# Patient Record
Sex: Female | Born: 1935 | Race: Black or African American | Hispanic: No | Smoking: Never smoker
Health system: Southern US, Community
[De-identification: ages and names within clinical notes are randomized; demographics above are authoritative.]

## PROBLEM LIST (undated history)

## (undated) DIAGNOSIS — I639 Cerebral infarction, unspecified: Secondary | ICD-10-CM

## (undated) DIAGNOSIS — G309 Alzheimer's disease, unspecified: Secondary | ICD-10-CM

## (undated) DIAGNOSIS — F0280 Dementia in other diseases classified elsewhere without behavioral disturbance: Secondary | ICD-10-CM

## (undated) DIAGNOSIS — I10 Essential (primary) hypertension: Secondary | ICD-10-CM

## (undated) DIAGNOSIS — H409 Unspecified glaucoma: Secondary | ICD-10-CM

## (undated) DIAGNOSIS — I2510 Atherosclerotic heart disease of native coronary artery without angina pectoris: Secondary | ICD-10-CM

---

## 2014-03-11 ENCOUNTER — Emergency Department (HOSPITAL_COMMUNITY): Payer: Medicare Other

## 2014-03-11 ENCOUNTER — Encounter (HOSPITAL_COMMUNITY): Payer: Self-pay | Admitting: Emergency Medicine

## 2014-03-11 ENCOUNTER — Emergency Department (HOSPITAL_COMMUNITY)
Admission: EM | Admit: 2014-03-11 | Discharge: 2014-03-11 | Disposition: A | Discharge status: Home / Self Care | Payer: Medicare Other | Source: Home / Self Care | Attending: Emergency Medicine | Admitting: Emergency Medicine

## 2014-03-11 DIAGNOSIS — Y9289 Other specified places as the place of occurrence of the external cause: Secondary | ICD-10-CM | POA: Insufficient documentation

## 2014-03-11 DIAGNOSIS — Y9389 Activity, other specified: Secondary | ICD-10-CM | POA: Insufficient documentation

## 2014-03-11 DIAGNOSIS — Z8669 Personal history of other diseases of the nervous system and sense organs: Secondary | ICD-10-CM | POA: Insufficient documentation

## 2014-03-11 DIAGNOSIS — F0280 Dementia in other diseases classified elsewhere without behavioral disturbance: Secondary | ICD-10-CM

## 2014-03-11 DIAGNOSIS — Z8673 Personal history of transient ischemic attack (TIA), and cerebral infarction without residual deficits: Secondary | ICD-10-CM | POA: Insufficient documentation

## 2014-03-11 DIAGNOSIS — W06XXXA Fall from bed, initial encounter: Secondary | ICD-10-CM

## 2014-03-11 DIAGNOSIS — I10 Essential (primary) hypertension: Secondary | ICD-10-CM

## 2014-03-11 DIAGNOSIS — G309 Alzheimer's disease, unspecified: Secondary | ICD-10-CM

## 2014-03-11 DIAGNOSIS — M79642 Pain in left hand: Secondary | ICD-10-CM | POA: Diagnosis not present

## 2014-03-11 DIAGNOSIS — S72142A Displaced intertrochanteric fracture of left femur, initial encounter for closed fracture: Secondary | ICD-10-CM | POA: Diagnosis not present

## 2014-03-11 DIAGNOSIS — S0512XA Contusion of eyeball and orbital tissues, left eye, initial encounter: Secondary | ICD-10-CM | POA: Insufficient documentation

## 2014-03-11 DIAGNOSIS — H05232 Hemorrhage of left orbit: Secondary | ICD-10-CM

## 2014-03-11 HISTORY — DX: Dementia in other diseases classified elsewhere, unspecified severity, without behavioral disturbance, psychotic disturbance, mood disturbance, and anxiety: F02.80

## 2014-03-11 HISTORY — DX: Cerebral infarction, unspecified: I63.9

## 2014-03-11 HISTORY — DX: Essential (primary) hypertension: I10

## 2014-03-11 HISTORY — DX: Unspecified glaucoma: H40.9

## 2014-03-11 HISTORY — DX: Atherosclerotic heart disease of native coronary artery without angina pectoris: I25.10

## 2014-03-11 HISTORY — DX: Alzheimer's disease, unspecified: G30.9

## 2014-03-11 NOTE — ED Notes (Signed)
Patient transported to CT 

## 2014-03-11 NOTE — Discharge Instructions (Signed)
Hematoma °A hematoma is a collection of blood under the skin, in an organ, in a body space, in a joint space, or in other tissue. The blood can clot to form a lump that you can see and feel. The lump is often firm and may sometimes become sore and tender. Most hematomas get better in a few days to weeks. However, some hematomas may be serious and require medical care. Hematomas can range in size from very small to very large. °CAUSES  °A hematoma can be caused by a blunt or penetrating injury. It can also be caused by spontaneous leakage from a blood vessel under the skin. Spontaneous leakage from a blood vessel is more likely to occur in older people, especially those taking blood thinners. Sometimes, a hematoma can develop after certain medical procedures. °SIGNS AND SYMPTOMS  °· A firm lump on the body. °· Possible pain and tenderness in the area. °· Bruising. Blue, dark blue, purple-red, or yellowish skin may appear at the site of the hematoma if the hematoma is close to the surface of the skin. °For hematomas in deeper tissues or body spaces, the signs and symptoms may be subtle. For example, an intra-abdominal hematoma may cause abdominal pain, weakness, fainting, and shortness of breath. An intracranial hematoma may cause a headache or symptoms such as weakness, trouble speaking, or a change in consciousness. °DIAGNOSIS  °A hematoma can usually be diagnosed based on your medical history and a physical exam. Imaging tests may be needed if your health care provider suspects a hematoma in deeper tissues or body spaces, such as the abdomen, head, or chest. These tests may include ultrasonography or a CT scan.  °TREATMENT  °Hematomas usually go away on their own over time. Rarely does the blood need to be drained out of the body. Large hematomas or those that may affect vital organs will sometimes need surgical drainage or monitoring. °HOME CARE INSTRUCTIONS  °· Apply ice to the injured area:   °¨ Put ice in a  plastic bag.   °¨ Place a towel between your skin and the bag.   °¨ Leave the ice on for 20 minutes, 2-3 times a day for the first 1 to 2 days.   °· After the first 2 days, switch to using warm compresses on the hematoma.   °· Elevate the injured area to help decrease pain and swelling. Wrapping the area with an elastic bandage may also be helpful. Compression helps to reduce swelling and promotes shrinking of the hematoma. Make sure the bandage is not wrapped too tight.   °· If your hematoma is on a lower extremity and is painful, crutches may be helpful for a couple days.   °· Only take over-the-counter or prescription medicines as directed by your health care provider. °SEEK IMMEDIATE MEDICAL CARE IF:  °· You have increasing pain, or your pain is not controlled with medicine.   °· You have a fever.   °· You have worsening swelling or discoloration.   °· Your skin over the hematoma breaks or starts bleeding.   °· Your hematoma is in your chest or abdomen and you have weakness, shortness of breath, or a change in consciousness. °· Your hematoma is on your scalp (caused by a fall or injury) and you have a worsening headache or a change in alertness or consciousness. °MAKE SURE YOU:  °· Understand these instructions. °· Will watch your condition. °· Will get help right away if you are not doing well or get worse. °Document Released: 12/30/2003 Document Revised: 01/17/2013 Document Reviewed: 10/25/2012 °  ExitCare® Patient Information ©2015 [employer]. This information is not intended to replace advice given to you by your health care provider. Make sure you discuss any questions you have with your health care provider. °Head Injury °You have received a head injury. It does not appear serious at this time. Headaches and vomiting are common following head injury. It should be easy to awaken from sleeping. Sometimes it is necessary for you to stay in the emergency department for a while for observation. Sometimes  admission to the hospital may be needed. After injuries such as yours, most problems occur within the first 24 hours, but side effects may occur up to 7-10 days after the injury. It is important for you to carefully monitor your condition and contact your health care provider or seek immediate medical care if there is a change in your condition. °WHAT ARE THE TYPES OF HEAD INJURIES? °Head injuries can be as minor as a bump. Some head injuries can be more severe. More severe head injuries include: °· A jarring injury to the brain (concussion). °· A bruise of the brain (contusion). This mean there is bleeding in the brain that can cause swelling. °· A cracked skull (skull fracture). °· Bleeding in the brain that collects, clots, and forms a bump (hematoma). °WHAT CAUSES A HEAD INJURY? °A serious head injury is most likely to happen to someone who is in a car wreck and is not wearing a seat belt. Other causes of major head injuries include bicycle or motorcycle accidents, sports injuries, and falls. °HOW ARE HEAD INJURIES DIAGNOSED? °A complete history of the event leading to the injury and your current symptoms will be helpful in diagnosing head injuries. Many times, pictures of the brain, such as CT or MRI are needed to see the extent of the injury. Often, an overnight hospital stay is necessary for observation.  °WHEN SHOULD I SEEK IMMEDIATE MEDICAL CARE?  °You should get help right away if: °· You have confusion or drowsiness. °· You feel sick to your stomach (nauseous) or have continued, forceful vomiting. °· You have dizziness or unsteadiness that is getting worse. °· You have severe, continued headaches not relieved by medicine. Only take over-the-counter or prescription medicines for pain, fever, or discomfort as directed by your health care provider. °· You do not have normal function of the arms or legs or are unable to walk. °· You notice changes in the black spots in the center of the colored part of your  eye (pupil). °· You have a clear or bloody fluid coming from your nose or ears. °· You have a loss of vision. °During the next 24 hours after the injury, you must stay with someone who can watch you for the warning signs. This person should contact local emergency services (911 in the [place] if you have seizures, you become unconscious, or you are unable to wake up. °HOW CAN I PREVENT A HEAD INJURY IN THE FUTURE? °The most important factor for preventing major head injuries is avoiding motor vehicle accidents.  To minimize the potential for damage to your head, it is crucial to wear seat belts while riding in motor vehicles. Wearing helmets while bike riding and playing collision sports (like football) is also helpful. Also, avoiding dangerous activities around the house will further help reduce your risk of head injury.  °WHEN CAN I RETURN TO NORMAL ACTIVITIES AND ATHLETICS? °You should be reevaluated by your health care provider before returning to these activities. If you have any   of the following symptoms, you should not return to activities or contact sports until 1 week after the symptoms have stopped: °· Persistent headache. °· Dizziness or vertigo. °· Poor attention and concentration. °· Confusion. °· Memory problems. °· Nausea or vomiting. °· Fatigue or tire easily. °· Irritability. °· Intolerant of bright lights or loud noises. °· Anxiety or depression. °· Disturbed sleep. °MAKE SURE YOU:  °· Understand these instructions. °· Will watch your condition. °· Will get help right away if you are not doing well or get worse. °Document Released: 05/17/2005 Document Revised: 05/22/2013 Document Reviewed: 01/22/2013 °ExitCare® Patient Information ©2015 [employer]. This information is not intended to replace advice given to you by your health care provider. Make sure you discuss any questions you have with your health care provider. ° °

## 2014-03-11 NOTE — ED Notes (Signed)
Pt [facility] out of bed [facility], she has a head hematoma

## 2014-03-11 NOTE — ED Notes (Signed)
Pt given ice bag and placed on left side of face where eye is swollen

## 2014-03-11 NOTE — ED Notes (Signed)
Bed: WA15 Expected date:  Expected time:  Means of arrival:  Comments: EMS  

## 2014-03-11 NOTE — ED Notes (Signed)
[facility] called for transport back to SNF 

## 2014-03-11 NOTE — ED Provider Notes (Signed)
CSN: [id number]     Arrival date & time 03/11/14  0520 History   First MD Initiated Contact with Patient 03/11/14 0526     Chief Complaint  Patient presents with  . Fall     (Consider location/radiation/quality/duration/timing/severity/associated sxs/prior Treatment) HPI Comments: Patient here after being found was left-sided facial swelling in her bed. She has a history of dementia and it is suspected that she fell. Unknown loss of consciousness. She is at her baseline at this time. Has facial swelling at the left lateral periorbital area. No eye trauma noted. No other injuries noted. EMS called and patient transported here.  Patient is a 80 y.o. female presenting with fall. The history is provided by the patient and the EMS personnel. The history is limited by the condition of the patient.  Fall    Past Medical History  Diagnosis Date  . Alzheimer's dementia   . Hypertension   . Glaucoma   . CVA (cerebral infarction)   . Arteriosclerotic cardiovascular disease    History reviewed. No pertinent past surgical history. History reviewed. No pertinent family history. History  Substance Use Topics  . Smoking status: Never Smoker   . Smokeless tobacco: Not on file  . Alcohol Use: No   OB History   Grav Para Term Preterm Abortions TAB SAB Ect Mult Living                 Review of Systems  Unable to perform ROS     Allergies  Review of patient's allergies indicates no known allergies.  Home Medications   Prior to Admission medications   Not on File   BP 160/88  Pulse 70  Temp(Src) 97.8 F (36.6 C) (Oral)  Resp 18  SpO2 95% Physical Exam  Nursing note and vitals reviewed. Constitutional: She appears well-developed and well-nourished.  Non-toxic appearance. No distress.  HENT:  Head: Normocephalic and atraumatic.    Eyes: Conjunctivae, EOM and lids are normal. Pupils are equal, round, and reactive to light. Left conjunctiva has no hemorrhage.  No hyphema  noted.  Neck: Normal range of motion. Neck supple. No tracheal deviation present. No mass present.  Cardiovascular: Normal rate, regular rhythm and normal heart sounds.  Exam reveals no gallop.   No murmur heard. Pulmonary/Chest: Effort normal and breath sounds normal. No stridor. No respiratory distress. She has no decreased breath sounds. She has no wheezes. She has no rhonchi. She has no rales.  Abdominal: Soft. Normal appearance and bowel sounds are normal. She exhibits no distension. There is no tenderness. There is no rebound and no CVA tenderness.  Musculoskeletal: Normal range of motion. She exhibits no edema and no tenderness.  Neurological: She is alert. She has normal strength. No cranial nerve deficit or sensory deficit. GCS eye subscore is 4. GCS verbal subscore is 4. GCS motor subscore is 6.  Skin: Skin is warm and dry. No abrasion and no rash noted.  Psychiatric: Her affect is blunt.    ED Course  Procedures (including critical care time) Labs Review Labs Reviewed - No data to display  Imaging Review No results found.   EKG Interpretation None      MDM   Final diagnoses:  None   His CT results noted and patient stable for discharge    Toy BakerAnthony T Breona Cherubin, MD 03/11/14 [phone]

## 2014-03-14 ENCOUNTER — Inpatient Hospital Stay (HOSPITAL_COMMUNITY)
Admission: EM | Admit: 2014-03-14 | Discharge: 2014-03-19 | DRG: 481 | Disposition: A | Discharge status: Skilled Nursing Facility | Payer: Medicare Other | Attending: Family Medicine | Admitting: Family Medicine

## 2014-03-14 ENCOUNTER — Encounter (HOSPITAL_COMMUNITY): Payer: Self-pay | Admitting: Emergency Medicine

## 2014-03-14 ENCOUNTER — Observation Stay (HOSPITAL_COMMUNITY): Payer: Medicare Other | Admitting: Anesthesiology

## 2014-03-14 ENCOUNTER — Observation Stay (HOSPITAL_COMMUNITY): Payer: Medicare Other

## 2014-03-14 ENCOUNTER — Emergency Department (HOSPITAL_COMMUNITY): Payer: Medicare Other

## 2014-03-14 ENCOUNTER — Encounter (HOSPITAL_COMMUNITY)
Admission: EM | Disposition: A | Discharge status: Skilled Nursing Facility | Payer: Self-pay | Source: Home / Self Care | Attending: Internal Medicine

## 2014-03-14 ENCOUNTER — Encounter (HOSPITAL_COMMUNITY): Payer: Medicare Other | Admitting: Anesthesiology

## 2014-03-14 DIAGNOSIS — G309 Alzheimer's disease, unspecified: Secondary | ICD-10-CM | POA: Diagnosis present

## 2014-03-14 DIAGNOSIS — S72002A Fracture of unspecified part of neck of left femur, initial encounter for closed fracture: Secondary | ICD-10-CM

## 2014-03-14 DIAGNOSIS — R7989 Other specified abnormal findings of blood chemistry: Secondary | ICD-10-CM | POA: Diagnosis not present

## 2014-03-14 DIAGNOSIS — B370 Candidal stomatitis: Secondary | ICD-10-CM | POA: Diagnosis not present

## 2014-03-14 DIAGNOSIS — S0083XA Contusion of other part of head, initial encounter: Secondary | ICD-10-CM | POA: Diagnosis present

## 2014-03-14 DIAGNOSIS — H409 Unspecified glaucoma: Secondary | ICD-10-CM | POA: Diagnosis present

## 2014-03-14 DIAGNOSIS — I10 Essential (primary) hypertension: Secondary | ICD-10-CM | POA: Diagnosis present

## 2014-03-14 DIAGNOSIS — D72829 Elevated white blood cell count, unspecified: Secondary | ICD-10-CM | POA: Diagnosis present

## 2014-03-14 DIAGNOSIS — D638 Anemia in other chronic diseases classified elsewhere: Secondary | ICD-10-CM | POA: Diagnosis present

## 2014-03-14 DIAGNOSIS — Z79899 Other long term (current) drug therapy: Secondary | ICD-10-CM

## 2014-03-14 DIAGNOSIS — Y92129 Unspecified place in nursing home as the place of occurrence of the external cause: Secondary | ICD-10-CM

## 2014-03-14 DIAGNOSIS — D649 Anemia, unspecified: Secondary | ICD-10-CM

## 2014-03-14 DIAGNOSIS — R296 Repeated falls: Secondary | ICD-10-CM | POA: Diagnosis present

## 2014-03-14 DIAGNOSIS — S72142A Displaced intertrochanteric fracture of left femur, initial encounter for closed fracture: Principal | ICD-10-CM | POA: Diagnosis present

## 2014-03-14 DIAGNOSIS — S72002S Fracture of unspecified part of neck of left femur, sequela: Secondary | ICD-10-CM

## 2014-03-14 DIAGNOSIS — F0280 Dementia in other diseases classified elsewhere without behavioral disturbance: Secondary | ICD-10-CM | POA: Diagnosis present

## 2014-03-14 DIAGNOSIS — M79642 Pain in left hand: Secondary | ICD-10-CM | POA: Diagnosis present

## 2014-03-14 DIAGNOSIS — Z8673 Personal history of transient ischemic attack (TIA), and cerebral infarction without residual deficits: Secondary | ICD-10-CM | POA: Diagnosis not present

## 2014-03-14 DIAGNOSIS — W19XXXA Unspecified fall, initial encounter: Secondary | ICD-10-CM | POA: Diagnosis present

## 2014-03-14 DIAGNOSIS — M24562 Contracture, left knee: Secondary | ICD-10-CM | POA: Diagnosis present

## 2014-03-14 DIAGNOSIS — N179 Acute kidney failure, unspecified: Secondary | ICD-10-CM | POA: Diagnosis not present

## 2014-03-14 DIAGNOSIS — I2510 Atherosclerotic heart disease of native coronary artery without angina pectoris: Secondary | ICD-10-CM | POA: Diagnosis present

## 2014-03-14 DIAGNOSIS — Z419 Encounter for procedure for purposes other than remedying health state, unspecified: Secondary | ICD-10-CM

## 2014-03-14 DIAGNOSIS — S72009A Fracture of unspecified part of neck of unspecified femur, initial encounter for closed fracture: Secondary | ICD-10-CM

## 2014-03-14 HISTORY — PX: FEMUR IM NAIL: SHX1597

## 2014-03-14 HISTORY — DX: Cerebral infarction, unspecified: I63.9

## 2014-03-14 LAB — URINALYSIS, ROUTINE W REFLEX MICROSCOPIC
BILIRUBIN URINE: NEGATIVE
Glucose, UA: NEGATIVE mg/dL
HGB URINE DIPSTICK: NEGATIVE
KETONES UR: NEGATIVE mg/dL
Leukocytes, UA: NEGATIVE
NITRITE: NEGATIVE
PH: 6 (ref 5.0–8.0)
Protein, ur: NEGATIVE mg/dL
SPECIFIC GRAVITY, URINE: 1.019 (ref 1.005–1.030)
Urobilinogen, UA: 0.2 mg/dL (ref 0.0–1.0)

## 2014-03-14 LAB — ABO/RH: ABO/RH(D): O NEG

## 2014-03-14 LAB — SURGICAL PCR SCREEN
MRSA, PCR: NEGATIVE
Staphylococcus aureus: NEGATIVE

## 2014-03-14 LAB — TROPONIN I: Troponin I: <0.3 ng/mL (ref <0.30)

## 2014-03-14 LAB — BASIC METABOLIC PANEL
Anion gap: 12 (ref 5–15)
BUN: 30 mg/dL — AB (ref 6–23)
CO2: 27 mEq/L (ref 19–32)
CREATININE: 0.85 mg/dL (ref 0.50–1.10)
Calcium: 9.5 mg/dL (ref 8.4–10.5)
Chloride: 99 mEq/L (ref 96–112)
GFR calc non Af Amer: 59 mL/min — ABNORMAL LOW (ref >90)
GFR, EST AFRICAN AMERICAN: 68 mL/min — AB (ref >90)
GLUCOSE: 145 mg/dL — AB (ref 70–99)
POTASSIUM: 3.9 meq/L (ref 3.7–5.3)
Sodium: 138 mEq/L (ref 137–147)

## 2014-03-14 LAB — CBC WITH DIFFERENTIAL/PLATELET
Basophils Absolute: 0 10*3/uL (ref 0.0–0.1)
Basophils Relative: 0 % (ref 0–1)
EOS ABS: 0 10*3/uL (ref 0.0–0.7)
Eosinophils Relative: 0 % (ref 0–5)
HCT: 31.9 % — ABNORMAL LOW (ref 36.0–46.0)
HEMOGLOBIN: 10.1 g/dL — AB (ref 12.0–15.0)
Lymphocytes Relative: 6 % — ABNORMAL LOW (ref 12–46)
Lymphs Abs: 0.8 10*3/uL (ref 0.7–4.0)
MCH: 27.6 pg (ref 26.0–34.0)
MCHC: 31.7 g/dL (ref 30.0–36.0)
MCV: 87.2 fL (ref 78.0–100.0)
MONOS PCT: 4 % (ref 3–12)
Monocytes Absolute: 0.5 10*3/uL (ref 0.1–1.0)
NEUTROS PCT: 90 % — AB (ref 43–77)
Neutro Abs: 12 10*3/uL — ABNORMAL HIGH (ref 1.7–7.7)
Platelets: 205 10*3/uL (ref 150–400)
RBC: 3.66 MIL/uL — AB (ref 3.87–5.11)
RDW: 13.9 % (ref 11.5–15.5)
WBC: 13.3 10*3/uL — ABNORMAL HIGH (ref 4.0–10.5)

## 2014-03-14 SURGERY — INSERTION, INTRAMEDULLARY ROD, FEMUR
Anesthesia: General | Site: Hip | Laterality: Left

## 2014-03-14 MED ORDER — ONDANSETRON HCL 4 MG/2ML IJ SOLN: 4.0000 mg | Freq: Four times a day (QID) | INTRAMUSCULAR | Status: DC | PRN

## 2014-03-14 MED ORDER — DEXAMETHASONE SODIUM PHOSPHATE 10 MG/ML IJ SOLN
INTRAMUSCULAR | Status: AC
  Filled 2014-03-14: qty 1

## 2014-03-14 MED ORDER — BACITRACIN-NEOMYCIN-POLYMYXIN 400-5-5000 EX OINT
1.0000 | TOPICAL_OINTMENT | CUTANEOUS | Status: DC | PRN
Start: 2014-03-14 — End: 2014-03-19

## 2014-03-14 MED ORDER — SENNA 8.6 MG PO TABS
1.0000 | ORAL_TABLET | Freq: Every day | ORAL | Status: DC
  Administered 2014-03-14 – 2014-03-18 (×5): 8.6 mg via ORAL

## 2014-03-14 MED ORDER — ACETAMINOPHEN 10 MG/ML IV SOLN
1000.0000 mg | Freq: Once | INTRAVENOUS | Status: DC
  Administered 2014-03-14: 1000 mg via INTRAVENOUS

## 2014-03-14 MED ORDER — DIVALPROEX SODIUM 125 MG PO CPSP
125.0000 mg | ORAL_CAPSULE | Freq: Two times a day (BID) | ORAL | Status: DC
  Administered 2014-03-14 – 2014-03-19 (×10): 125 mg via ORAL
  Filled 2014-03-14 (×12): qty 1

## 2014-03-14 MED ORDER — ONDANSETRON HCL 4 MG PO TABS: 4.0000 mg | ORAL_TABLET | Freq: Four times a day (QID) | ORAL | Status: DC | PRN

## 2014-03-14 MED ORDER — THERA-DERM EX LOTN
1.0000 "application " | TOPICAL_LOTION | Freq: Every day | CUTANEOUS | Status: DC
  Administered 2014-03-14 – 2014-03-18 (×4): 1 via TOPICAL
  Filled 2014-03-14: qty 236

## 2014-03-14 MED ORDER — ACETAMINOPHEN 500 MG PO TABS: 500.0000 mg | ORAL_TABLET | ORAL | Status: DC | PRN

## 2014-03-14 MED ORDER — ALBUTEROL SULFATE (2.5 MG/3ML) 0.083% IN NEBU: 2.5000 mg | INHALATION_SOLUTION | RESPIRATORY_TRACT | Status: DC | PRN

## 2014-03-14 MED ORDER — METOCLOPRAMIDE HCL 5 MG/ML IJ SOLN: 5.0000 mg | Freq: Three times a day (TID) | INTRAMUSCULAR | Status: DC | PRN

## 2014-03-14 MED ORDER — SODIUM CHLORIDE 0.9 % IV SOLN
10.0000 mg | INTRAVENOUS | Status: DC | PRN
  Administered 2014-03-14: 80 ug/min via INTRAVENOUS

## 2014-03-14 MED ORDER — DOCUSATE SODIUM 100 MG PO CAPS
100.0000 mg | ORAL_CAPSULE | Freq: Two times a day (BID) | ORAL | Status: DC
  Administered 2014-03-14 – 2014-03-18 (×8): 100 mg via ORAL

## 2014-03-14 MED ORDER — BISACODYL 5 MG PO TBEC: 5.0000 mg | DELAYED_RELEASE_TABLET | Freq: Every day | ORAL | Status: DC | PRN

## 2014-03-14 MED ORDER — HYDROCODONE-ACETAMINOPHEN 5-325 MG PO TABS: 1.0000 | ORAL_TABLET | ORAL | Status: AC | PRN

## 2014-03-14 MED ORDER — DEXAMETHASONE SODIUM PHOSPHATE 10 MG/ML IJ SOLN
INTRAMUSCULAR | Status: DC | PRN
  Administered 2014-03-14: 10 mg via INTRAVENOUS

## 2014-03-14 MED ORDER — CEFAZOLIN SODIUM-DEXTROSE 2-3 GM-% IV SOLR
INTRAVENOUS | Status: DC | PRN
  Administered 2014-03-14: 2 g via INTRAVENOUS

## 2014-03-14 MED ORDER — MAGNESIUM CITRATE PO SOLN
1.0000 | Freq: Once | ORAL | Status: AC | PRN
Start: 2014-03-14 — End: 2014-03-14

## 2014-03-14 MED ORDER — ATROPINE SULFATE 0.4 MG/ML IJ SOLN
INTRAMUSCULAR | Status: AC
  Filled 2014-03-14: qty 1

## 2014-03-14 MED ORDER — ALBUTEROL SULFATE HFA 108 (90 BASE) MCG/ACT IN AERS: 2.0000 | INHALATION_SPRAY | RESPIRATORY_TRACT | Status: DC | PRN

## 2014-03-14 MED ORDER — GLYCOPYRROLATE 0.2 MG/ML IJ SOLN
INTRAMUSCULAR | Status: DC | PRN
  Administered 2014-03-14: .2 mg via INTRAVENOUS

## 2014-03-14 MED ORDER — GUAIFENESIN 100 MG/5ML PO SYRP
100.0000 mg | ORAL_SOLUTION | Freq: Four times a day (QID) | ORAL | Status: DC | PRN
  Filled 2014-03-14: qty 5

## 2014-03-14 MED ORDER — MORPHINE SULFATE 2 MG/ML IJ SOLN: 2.0000 mg | INTRAMUSCULAR | Status: DC | PRN

## 2014-03-14 MED ORDER — CEFAZOLIN SODIUM-DEXTROSE 2-3 GM-% IV SOLR
INTRAVENOUS | Status: AC
  Filled 2014-03-14: qty 50

## 2014-03-14 MED ORDER — FENTANYL CITRATE 0.05 MG/ML IJ SOLN
INTRAMUSCULAR | Status: AC
  Filled 2014-03-14: qty 2

## 2014-03-14 MED ORDER — SODIUM CHLORIDE 0.9 % IJ SOLN
INTRAMUSCULAR | Status: AC
  Filled 2014-03-14: qty 10

## 2014-03-14 MED ORDER — FENTANYL CITRATE 0.05 MG/ML IJ SOLN
INTRAMUSCULAR | Status: DC | PRN
  Administered 2014-03-14: 25 ug via INTRAVENOUS

## 2014-03-14 MED ORDER — NEOSTIGMINE METHYLSULFATE 10 MG/10ML IV SOLN
INTRAVENOUS | Status: DC | PRN
  Administered 2014-03-14: 1 mg via INTRAVENOUS

## 2014-03-14 MED ORDER — FENTANYL CITRATE 0.05 MG/ML IJ SOLN
INTRAMUSCULAR | Status: AC
  Filled 2014-03-14: qty 5

## 2014-03-14 MED ORDER — PROPOFOL 10 MG/ML IV BOLUS: INTRAVENOUS | Status: DC | PRN

## 2014-03-14 MED ORDER — DOCUSATE SODIUM 150 MG/15ML PO LIQD: 100.0000 mg | Freq: Two times a day (BID) | ORAL | Status: DC

## 2014-03-14 MED ORDER — LIDOCAINE HCL (CARDIAC) 20 MG/ML IV SOLN
INTRAVENOUS | Status: DC | PRN
  Administered 2014-03-14: 50 mg via INTRAVENOUS

## 2014-03-14 MED ORDER — SODIUM CHLORIDE 0.9 % IR SOLN
Status: DC | PRN
  Administered 2014-03-14: 20:00:00

## 2014-03-14 MED ORDER — PHENYLEPHRINE HCL 10 MG/ML IJ SOLN
INTRAMUSCULAR | Status: DC | PRN
  Administered 2014-03-14: 80 ug via INTRAVENOUS

## 2014-03-14 MED ORDER — CEFAZOLIN SODIUM-DEXTROSE 2-3 GM-% IV SOLR: 2.0000 g | INTRAVENOUS | Status: DC

## 2014-03-14 MED ORDER — PHENOL 1.4 % MT LIQD
1.0000 | OROMUCOSAL | Status: DC | PRN
  Filled 2014-03-14: qty 177

## 2014-03-14 MED ORDER — LACTATED RINGERS IV SOLN
INTRAVENOUS | Status: DC | PRN
  Administered 2014-03-14: 18:00:00 via INTRAVENOUS

## 2014-03-14 MED ORDER — PROPOFOL 10 MG/ML IV BOLUS
INTRAVENOUS | Status: AC
  Filled 2014-03-14: qty 20

## 2014-03-14 MED ORDER — SENNOSIDES-DOCUSATE SODIUM 8.6-50 MG PO TABS: 1.0000 | ORAL_TABLET | Freq: Every evening | ORAL | Status: DC | PRN

## 2014-03-14 MED ORDER — SODIUM CHLORIDE 0.9 % IV SOLN: INTRAVENOUS | Status: AC

## 2014-03-14 MED ORDER — SUCCINYLCHOLINE CHLORIDE 20 MG/ML IJ SOLN
INTRAMUSCULAR | Status: DC | PRN
  Administered 2014-03-14: 60 mg via INTRAVENOUS

## 2014-03-14 MED ORDER — ONDANSETRON HCL 4 MG/2ML IJ SOLN
INTRAMUSCULAR | Status: DC | PRN
  Administered 2014-03-14 (×2): 2 mg via INTRAVENOUS

## 2014-03-14 MED ORDER — FENTANYL CITRATE 0.05 MG/ML IJ SOLN
25.0000 ug | INTRAMUSCULAR | Status: DC | PRN
  Administered 2014-03-14 (×4): 25 ug via INTRAVENOUS

## 2014-03-14 MED ORDER — METOCLOPRAMIDE HCL 10 MG PO TABS: 5.0000 mg | ORAL_TABLET | Freq: Three times a day (TID) | ORAL | Status: DC | PRN

## 2014-03-14 MED ORDER — CEFAZOLIN SODIUM-DEXTROSE 2-3 GM-% IV SOLR
2.0000 g | Freq: Four times a day (QID) | INTRAVENOUS | Status: AC
  Administered 2014-03-14 – 2014-03-15 (×3): 2 g via INTRAVENOUS
  Filled 2014-03-14 (×3): qty 50

## 2014-03-14 MED ORDER — ONDANSETRON HCL 4 MG/2ML IJ SOLN: 4.0000 mg | Freq: Once | INTRAMUSCULAR | Status: DC | PRN

## 2014-03-14 MED ORDER — ASPIRIN 81 MG PO CHEW
81.0000 mg | CHEWABLE_TABLET | Freq: Every day | ORAL | Status: DC
  Administered 2014-03-15: 81 mg via ORAL
  Filled 2014-03-14 (×2): qty 1

## 2014-03-14 MED ORDER — PROPOFOL 10 MG/ML IV BOLUS
INTRAVENOUS | Status: DC | PRN
  Administered 2014-03-14: 40 mg via INTRAVENOUS
  Administered 2014-03-14: 100 mg via INTRAVENOUS

## 2014-03-14 MED ORDER — MORPHINE SULFATE 2 MG/ML IJ SOLN
2.0000 mg | INTRAMUSCULAR | Status: DC | PRN
  Administered 2014-03-14: 2 mg via INTRAVENOUS
  Filled 2014-03-14: qty 1

## 2014-03-14 MED ORDER — AMLODIPINE BESYLATE 5 MG PO TABS
5.0000 mg | ORAL_TABLET | Freq: Every day | ORAL | Status: DC
  Administered 2014-03-15 – 2014-03-19 (×5): 5 mg via ORAL
  Filled 2014-03-14 (×8): qty 1

## 2014-03-14 MED ORDER — 0.9 % SODIUM CHLORIDE (POUR BTL) OPTIME
TOPICAL | Status: DC | PRN
  Administered 2014-03-14: 1000 mL

## 2014-03-14 MED ORDER — HYDRALAZINE HCL 20 MG/ML IJ SOLN: 10.0000 mg | Freq: Four times a day (QID) | INTRAMUSCULAR | Status: DC | PRN

## 2014-03-14 MED ORDER — ONDANSETRON HCL 4 MG/2ML IJ SOLN
INTRAMUSCULAR | Status: AC
  Filled 2014-03-14: qty 2

## 2014-03-14 MED ORDER — EPHEDRINE SULFATE 50 MG/ML IJ SOLN
INTRAMUSCULAR | Status: AC
  Filled 2014-03-14: qty 1

## 2014-03-14 MED ORDER — ACETAMINOPHEN 325 MG PO TABS: 650.0000 mg | ORAL_TABLET | Freq: Four times a day (QID) | ORAL | Status: DC | PRN

## 2014-03-14 MED ORDER — MORPHINE SULFATE 2 MG/ML IJ SOLN
0.5000 mg | INTRAMUSCULAR | Status: DC | PRN
  Administered 2014-03-17: 0.5 mg via INTRAVENOUS
  Filled 2014-03-14: qty 1

## 2014-03-14 MED ORDER — ASPIRIN EC 325 MG PO TBEC
325.0000 mg | DELAYED_RELEASE_TABLET | Freq: Every day | ORAL | Status: DC
  Administered 2014-03-15: 325 mg via ORAL
  Filled 2014-03-14 (×2): qty 1

## 2014-03-14 MED ORDER — SODIUM CHLORIDE 0.9 % IV SOLN
INTRAVENOUS | Status: DC
  Administered 2014-03-14 – 2014-03-15 (×2): via INTRAVENOUS

## 2014-03-14 MED ORDER — CISATRACURIUM BESYLATE (PF) 10 MG/5ML IV SOLN
INTRAVENOUS | Status: DC | PRN
  Administered 2014-03-14: 8 mg via INTRAVENOUS

## 2014-03-14 MED ORDER — HYDROCODONE-ACETAMINOPHEN 5-325 MG PO TABS
1.0000 | ORAL_TABLET | Freq: Four times a day (QID) | ORAL | Status: DC | PRN
  Administered 2014-03-15 – 2014-03-17 (×5): 1 via ORAL
  Filled 2014-03-14 (×4): qty 1

## 2014-03-14 MED ORDER — EPHEDRINE SULFATE 50 MG/ML IJ SOLN
INTRAMUSCULAR | Status: DC | PRN
  Administered 2014-03-14: 5 mg via INTRAVENOUS

## 2014-03-14 MED ORDER — PHENYLEPHRINE HCL 10 MG/ML IJ SOLN
INTRAMUSCULAR | Status: AC
  Filled 2014-03-14: qty 1

## 2014-03-14 MED ORDER — MENTHOL 3 MG MT LOZG
1.0000 | LOZENGE | OROMUCOSAL | Status: DC | PRN
  Filled 2014-03-14: qty 9

## 2014-03-14 MED ORDER — ASPIRIN EC 325 MG PO TBEC: 325.0000 mg | DELAYED_RELEASE_TABLET | Freq: Every day | ORAL | Status: AC

## 2014-03-14 MED ORDER — VITAMIN D3 25 MCG (1000 UNIT) PO TABS: 1000.0000 [IU] | ORAL_TABLET | Freq: Every day | ORAL | Status: DC

## 2014-03-14 MED ORDER — LATANOPROST 0.005 % OP SOLN
1.0000 [drp] | Freq: Every day | OPHTHALMIC | Status: DC
  Administered 2014-03-14 – 2014-03-18 (×5): 1 [drp] via OPHTHALMIC
  Filled 2014-03-14: qty 2.5

## 2014-03-14 MED ORDER — HYDROCODONE-ACETAMINOPHEN 5-325 MG PO TABS
1.0000 | ORAL_TABLET | Freq: Four times a day (QID) | ORAL | Status: DC | PRN
  Filled 2014-03-14: qty 1

## 2014-03-14 MED ORDER — LIDOCAINE HCL (CARDIAC) 20 MG/ML IV SOLN
INTRAVENOUS | Status: AC
  Filled 2014-03-14: qty 5

## 2014-03-14 MED ORDER — VITAMIN D3 50 MCG (2000 UT) PO TABS: 1.0000 | ORAL_TABLET | Freq: Every day | ORAL | Status: DC

## 2014-03-14 MED ORDER — SODIUM CHLORIDE 0.9 % IR SOLN
Status: AC
  Filled 2014-03-14: qty 1

## 2014-03-14 MED ORDER — SUCCINYLCHOLINE CHLORIDE 20 MG/ML IJ SOLN: INTRAMUSCULAR | Status: DC | PRN

## 2014-03-14 MED ORDER — ACETAMINOPHEN 650 MG RE SUPP: 650.0000 mg | Freq: Four times a day (QID) | RECTAL | Status: DC | PRN

## 2014-03-14 MED ORDER — LISINOPRIL 10 MG PO TABS
10.0000 mg | ORAL_TABLET | Freq: Every day | ORAL | Status: DC
  Administered 2014-03-14 – 2014-03-17 (×4): 10 mg via ORAL
  Filled 2014-03-14 (×5): qty 1

## 2014-03-14 SURGICAL SUPPLY — 43 items
BAG ZIPLOCK 12X15 (MISCELLANEOUS) ×3 IMPLANT
BIT DRILL CANN LG 4.3MM (BIT) ×1 IMPLANT
BNDG COHESIVE 6X5 TAN STRL LF (GAUZE/BANDAGES/DRESSINGS) IMPLANT
BNDG GAUZE ELAST 4 BULKY (GAUZE/BANDAGES/DRESSINGS) ×3 IMPLANT
CLOTH 2% CHLOROHEXIDINE 3PK (PERSONAL CARE ITEMS) IMPLANT
DRAPE C-ARM 42X120 X-RAY (DRAPES) ×3 IMPLANT
DRAPE STERI IOBAN 125X83 (DRAPES) ×3 IMPLANT
DRILL BIT CANN LG 4.3MM (BIT) ×3
DRSG PAD ABDOMINAL 8X10 ST (GAUZE/BANDAGES/DRESSINGS) ×3 IMPLANT
DURAPREP 26ML APPLICATOR (WOUND CARE) ×3 IMPLANT
ELECT REM PT RETURN 9FT ADLT (ELECTROSURGICAL) ×3
ELECTRODE REM PT RTRN 9FT ADLT (ELECTROSURGICAL) ×1 IMPLANT
GAUZE SPONGE 4X4 12PLY STRL (GAUZE/BANDAGES/DRESSINGS) ×3 IMPLANT
GLOVE BIOGEL PI IND STRL 7.5 (GLOVE) ×1 IMPLANT
GLOVE BIOGEL PI IND STRL 8 (GLOVE) ×1 IMPLANT
GLOVE BIOGEL PI INDICATOR 7.5 (GLOVE) ×2
GLOVE BIOGEL PI INDICATOR 8 (GLOVE) ×2
GLOVE SURG SS PI 7.5 STRL IVOR (GLOVE) ×3 IMPLANT
GLOVE SURG SS PI 8.0 STRL IVOR (GLOVE) ×3 IMPLANT
GOWN STRL REUS W/ TWL XL LVL3 (GOWN DISPOSABLE) ×1 IMPLANT
GOWN STRL REUS W/TWL LRG LVL3 (GOWN DISPOSABLE) ×3 IMPLANT
GOWN STRL REUS W/TWL XL LVL3 (GOWN DISPOSABLE) ×8 IMPLANT
GUIDEPIN 3.2X17.5 THRD DISP (PIN) ×3 IMPLANT
KIT BASIN OR (CUSTOM PROCEDURE TRAY) ×3 IMPLANT
MANIFOLD NEPTUNE II (INSTRUMENTS) ×3 IMPLANT
NAIL HIP FRACT 130D 11X180 (Screw) ×3 IMPLANT
PACK GENERAL/GYN (CUSTOM PROCEDURE TRAY) ×3 IMPLANT
PAD ABD 8X10 STRL (GAUZE/BANDAGES/DRESSINGS) ×3 IMPLANT
PAD CAST 4YDX4 CTTN HI CHSV (CAST SUPPLIES) IMPLANT
PADDING CAST COTTON 4X4 STRL (CAST SUPPLIES)
SCREW ANTI-ROTATION 75MM (Screw) ×3 IMPLANT
SCREW BONE CORTICAL 5.0X38 (Screw) ×3 IMPLANT
SCREW DRILL BIT ANIT ROTATION (BIT) ×3 IMPLANT
SCREW LAG HIP NAIL 10.5X95 (Screw) ×3 IMPLANT
SLEEVE SURGEON STRL (DRAPES) ×3 IMPLANT
STAPLER VISISTAT (STAPLE) IMPLANT
STAPLER VISISTAT 35W (STAPLE) ×3 IMPLANT
SUT VIC AB 0 CT1 27 (SUTURE) ×4
SUT VIC AB 0 CT1 27XBRD ANTBC (SUTURE) ×2 IMPLANT
SUT VIC AB 2-0 CT1 27 (SUTURE) ×2
SUT VIC AB 2-0 CT1 TAPERPNT 27 (SUTURE) ×1 IMPLANT
TAPE CLOTH SURG 4X10 WHT LF (GAUZE/BANDAGES/DRESSINGS) ×3 IMPLANT
TRAY FOLEY CATH 14FRSI W/METER (CATHETERS) ×3 IMPLANT

## 2014-03-14 NOTE — H&P (Addendum)
[facility] History and Physical  Biagio BorgMary Fiallos ZOX:[id number] DOB: August 04, 1920 DOA: 03/14/2014  Referring physician: ER physician PCP: Ron ParkerBOWEN,SAMUEL, MD   Chief Complaint: fall  HPI:  80 y.o. Female, resident of SNF, [facility] after an episode of fall at the facility. Please note that pt is unable to provide much history due to dementia. Per records, pt apparently complained of left hip pain 3 days prior to this admission and was noted to have contusion on the left lateral forehead. It was assumed she fell. She continued to complain of left hip pain and was brought in for further evaluation. There was no reported chest pain, shortness of breath, no reported abd or urinary concerns, no reported fevers, chills.  In ED, pt noted to be hemodynamically stable, VSS, X-ray findings consistent with left hip fracture. Plan for surgery later this afternoon.   Assessment & Plan    Principal Problem:   Closed left hip fracture  pt cleared form medical stand point for surgery  appreciate ortho input   will provide analgesia as needed  Active Problems:   HTN (hypertension)  slightly above target range  continue lisinopril  monitor closely and readjust the regimen as indicated    Leukocytosis  appears to be secondary to demargination/stress reaction secondary to hip fracture  no clear infectious etiology, UA and CXR unremarkable for an infectious etiology   hold off on ABX for now  repeat CBC in AM   Alzheimer's dementia  will need inpatient PT/OT  will go back to SNF once medically stable   Anemia of chronic disease  no signs of active bleeding  CBC in AM  DVT prophylaxis:  SCD's bilaterally   Radiological Exams on Admission: XRAY Hip Left10/15/15  Mildly displaced, varus angulated, foreshortened intertrochanteric fracture of the left hip.   CXR 03/14/2014  Cardiomegaly without acute disease.      EKG: pending   Code Status: Full Family  Communication: No family at bedside  Disposition Plan: Admit for further evaluation  Manson PasseyEVINE, Jaiel Saraceno, MD  [facility] Pager [phone]  Review of Systems:  Unable to obtain due to dementia   Past Medical History  Diagnosis Date  . Alzheimer's dementia   . Hypertension   . Glaucoma   . CVA (cerebral infarction)   . Arteriosclerotic cardiovascular disease    History reviewed. No pertinent past surgical history. Social History:  reports that she has never smoked. She does not have any smokeless tobacco history on file. She reports that she does not drink alcohol or use illicit drugs.  No Known Allergies  Family History: Unable to obtain due to dementia   Medication Sig  acetaminophen (TYLENOL) 500 MG tablet Take 500 mg by mouth every 4 (four) hours as needed for mild pain, fever or headache.   albuterol (PROVENTIL HFA;VENTOLIN HFA) 108 (90 BASE) MCG/ACT inhaler Inhale 2 puffs into the lungs every 4 (four) hours as needed for shortness of breath.   alum & mag hydroxide-simeth (MAALOX PLUS) 400-400-40 MG/5ML suspension Take 30 mLs by mouth 4 (four) times daily as needed for indigestion (and heartburn).  aspirin 81 MG chewable tablet Chew 81 mg by mouth daily.  Cholecalciferol (VITAMIN D3) 2000 UNITS TABS Take 1 tablet by mouth daily.  divalproex (DEPAKOTE SPRINKLE) 125 MG capsule Take 125 mg by mouth 2 (two) times daily.  guaifenesin (ROBITUSSIN) 100 MG/5ML syrup Take 100 mg by mouth every 6 (six) hours as needed for cough.   lanolin/mineral oil (KERI/THERA-DERM) LOTN  Apply 1 application topically at bedtime. Apply to bilateral lower extemities  latanoprost (XALATAN) 0.005 % ophthalmic solution Place 1 drop into both eyes at bedtime.  lisinopril (PRINIVIL,ZESTRIL) 10 MG tablet Take 10 mg by mouth daily.  loperamide (IMODIUM) 2 MG capsule Take 2 mg by mouth as needed for diarrhea or loose stools.   magnesium hydroxide (MILK OF MAGNESIA) 400 MG/5ML suspension Take 30 mLs by mouth at  bedtime as needed for mild constipation.   neomycin-bacitracin-polymyxin (NEOSPORIN) ointment Apply 1 application topically as needed for wound care. apply to eye  senna (SENOKOT) 8.6 MG TABS tablet Take 1 tablet by mouth at bedtime.    Physical Exam: Filed Vitals:   03/14/14 0943 03/14/14 1058 03/14/14 1115 03/14/14 1316  BP: 204/70 181/86  140/114  Pulse: 106 86 81   Temp: 98.4 F (36.9 C)     TempSrc: Oral     Resp:  15 24   SpO2: 96% 98% 100%     Physical Exam  Constitutional: Appears calm, NAD HENT: Normocephalic. No tonsillar erythema or exudates Eyes: Conjunctivae and EOM are normal. PERRLA, no scleral icterus.  Neck: Normal ROM. Neck supple. No JVD. No tracheal deviation. No thyromegaly.  CVS: RRR, S1/S2 +, no murmurs, no gallops, no carotid bruit.  Pulmonary: Effort and breath sounds normal, no stridor, diminished breath sounds at bases  Abdominal: Soft. BS +,  no distension, tenderness, rebound or guarding.  Musculoskeletal: TTP at the left hip area  Lymphadenopathy: No lymphadenopathy noted, cervical, inguinal. Neuro: Alert. Able to follow some simple commands  Skin: Skin is warm and dry. No rash noted. Not diaphoretic. No erythema. No pallor.  Psychiatric: Normal mood  Labs on Admission:  Basic Metabolic Panel:  Recent Labs Lab 03/14/14 1050  NA 138  K 3.9  CL 99  CO2 27  GLUCOSE 145*  BUN 30*  CREATININE 0.85  CALCIUM 9.5   CBC:  Recent Labs Lab 03/14/14 1050  WBC 13.3*  NEUTROABS 12.0*  HGB 10.1*  HCT 31.9*  MCV 87.2  PLT 205   Cardiac Enzymes:  Recent Labs Lab 03/14/14 1050  TROPONINI <0.30    If 7PM-7AM, please contact night-coverage [email or web address] Password [email or web address] 03/14/2014, 1:18 PM

## 2014-03-14 NOTE — Brief Op Note (Signed)
03/14/2014  7:50 PM  PATIENT:  Desiree Myers  80 y.o. female  PRE-OPERATIVE DIAGNOSIS:  fx lip hip  POST-OPERATIVE DIAGNOSIS:  FRACTURE OF LEFT HIP  PROCEDURE:  Procedure(s): INTRAMEDULLARY (IM) NAIL FEMORAL (Left)  SURGEON:  Surgeon(s) and Role:    * Javier DockerJeffrey C Samiel Peel, MD - Primary  PHYSICIAN ASSISTANT:   ASSISTANTS: none   ANESTHESIA:   general  EBL:  Total I/O In: -  Out: 100 [Blood:100]  BLOOD ADMINISTERED:none  DRAINS: none   LOCAL MEDICATIONS USED:  MARCAINE     SPECIMEN:  No Specimen  DISPOSITION OF SPECIMEN:  N/A  COUNTS:  YES  TOURNIQUET:  * No tourniquets in log *  DICTATION: .Other Dictation: Dictation Number [id number]  PLAN OF CARE: Admit to inpatient   PATIENT DISPOSITION:  PACU - hemodynamically stable.   Delay start of Pharmacological VTE agent (>24hrs) due to surgical blood loss or risk of bleeding: yes

## 2014-03-14 NOTE — ED Provider Notes (Signed)
CSN: [id number]     Arrival date & time 03/14/14  [id number] History   First MD Initiated Contact with Patient 03/14/14 [phone]     Chief Complaint  Patient presents with  . Fall  . Hip Pain    Left side     Patient is a 80 y.o. female presenting with fall and hip pain. The history is provided by the patient, the nursing home and the EMS personnel. The history is limited by the condition of the patient (Hx dementia).  Fall  Hip Pain  Pt was seen at 1015.  Per EMS, NH report and pt: NH states pt c/o left hip pain since fall 3 days ago. At that time, pt was found in her bed with a new left lateral forehead contusion and the NH staff assumed she had fallen. Pt herself has hx of dementia, currently only c/o left hip pain. No reported new falls.    Past Medical History  Diagnosis Date  . Alzheimer's dementia   . Hypertension   . Glaucoma   . CVA (cerebral infarction)   . Arteriosclerotic cardiovascular disease    History reviewed. No pertinent past surgical history.  History  Substance Use Topics  . Smoking status: Never Smoker   . Smokeless tobacco: Not on file  . Alcohol Use: No    Review of Systems  Unable to perform ROS: Dementia      Allergies  Review of patient's allergies indicates no known allergies.  Home Medications   Prior to Admission medications   Medication Sig Start Date End Date Taking? Authorizing Provider  acetaminophen (TYLENOL) 500 MG tablet Take 500 mg by mouth every 4 (four) hours as needed for mild pain, fever or headache.    Yes Historical Provider, MD  acetaminophen (TYLENOL) 500 MG tablet Take 500 mg by mouth every 6 (six) hours.   Yes Historical Provider, MD  albuterol (PROVENTIL HFA;VENTOLIN HFA) 108 (90 BASE) MCG/ACT inhaler Inhale 2 puffs into the lungs every 4 (four) hours as needed for shortness of breath.    Yes Historical Provider, MD  alum & mag hydroxide-simeth (MAALOX PLUS) 400-400-40 MG/5ML suspension Take 30 mLs by mouth 4 (four) times  daily as needed for indigestion (and heartburn).   Yes Historical Provider, MD  aspirin 81 MG chewable tablet Chew 81 mg by mouth daily.   Yes Historical Provider, MD  Cholecalciferol (VITAMIN D3) 2000 UNITS TABS Take 1 tablet by mouth daily.   Yes Historical Provider, MD  divalproex (DEPAKOTE SPRINKLE) 125 MG capsule Take 125 mg by mouth 2 (two) times daily.   Yes Historical Provider, MD  Docusate Sodium (SILACE) 150 MG/15ML syrup Take 100 mg by mouth 2 (two) times daily.    Yes Historical Provider, MD  guaifenesin (ROBITUSSIN) 100 MG/5ML syrup Take 100 mg by mouth every 6 (six) hours as needed for cough.    Yes Historical Provider, MD  lanolin/mineral oil (KERI/THERA-DERM) LOTN Apply 1 application topically at bedtime. Apply to bilateral lower extemities   Yes Historical Provider, MD  latanoprost (XALATAN) 0.005 % ophthalmic solution Place 1 drop into both eyes at bedtime.   Yes Historical Provider, MD  lisinopril (PRINIVIL,ZESTRIL) 10 MG tablet Take 10 mg by mouth daily.   Yes Historical Provider, MD  loperamide (IMODIUM) 2 MG capsule Take 2 mg by mouth as needed for diarrhea or loose stools.    Yes Historical Provider, MD  magnesium hydroxide (MILK OF MAGNESIA) 400 MG/5ML suspension Take 30 mLs by mouth at bedtime as  needed for mild constipation.    Yes Historical Provider, MD  neomycin-bacitracin-polymyxin (NEOSPORIN) ointment Apply 1 application topically as needed for wound care. apply to eye   Yes Historical Provider, MD  senna (SENOKOT) 8.6 MG TABS tablet Take 1 tablet by mouth at bedtime.    Yes Historical Provider, MD   BP 181/86  Pulse 86  Temp(Src) 98.4 F (36.9 C) (Oral)  Resp 15  SpO2 98% Physical Exam 1020: Physical examination:  Nursing notes reviewed; Vital signs and O2 SAT reviewed;  Constitutional: Well developed, Well nourished, Well hydrated, In no acute distress; Head:  Normocephalic, +contusion left lateral forehead.; Eyes: EOMI, PERRL, No scleral icterus; ENMT: Mouth  and pharynx normal, Mucous membranes moist; Neck: Supple, Full range of motion, No lymphadenopathy; Cardiovascular: Regular rate and rhythm, No gallop; Respiratory: Breath sounds clear & equal bilaterally, No wheezes.  Speaking full sentences with ease, Normal respiratory effort/excursion; Chest: Nontender, Movement normal; Abdomen: Soft, Nontender, Nondistended, Normal bowel sounds; Genitourinary: No CVA tenderness; Extremities: Pulses normal, +left hip tenderness to palp. Pelvis stable. No edema, No calf edema or asymmetry.; Neuro: Awake, alert, confused per hx dementia. Major CN grossly intact. No facial droop. Speech clear. Decreased ROM left hip, otherwise moves all extremities on stretcher spontaneously.; Skin: Color normal, Warm, Dry.   ED Course  Procedures     EKG Interpretation   Date/Time:  Thursday March 14 2014 10:46:39 EDT Ventricular Rate:  93 PR Interval:  144 QRS Duration: 81 QT Interval:  419 QTC Calculation: 521 R Axis:   -18 Text Interpretation:  Sinus rhythm Left axis deviation Nonspecific ST and  T wave abnormality Prolonged QT interval Baseline wander Artifact No old  tracing to compare Confirmed by [facility]  MD, Nicholos Johns [phone]) on  03/14/2014 11:13:53 AM      MDM  MDM Reviewed: nursing note, vitals and previous chart Reviewed previous: CT scan Interpretation: labs, ECG and x-ray    Dg Hip Complete Left 03/14/2014   CLINICAL DATA:  80 year old female with history of trauma from a recent fall at a nursing home complaining of severe left-sided hip pain.  EXAM: LEFT HIP - COMPLETE 2+ VIEW  COMPARISON:  No priors.  FINDINGS: Four views of the bony pelvis and left hip demonstrate an intertrochanteric fracture of the left hip, with mild proximal migration of the distal fracture fragment and varus angulation (approximately 30-40 degrees) at site of fracture. Femoral head remains located in the acetabulum. Overlying soft tissues appear swollen. Bony pelvis itself  appears intact, as do the visualized portions of the right proximal femur. Numerous pelvic phleboliths are noted.  IMPRESSION: 1. Mildly displaced, varus angulated and foreshortened intertrochanteric fracture of the left hip.   Electronically Signed   By: Trudie Reed M.D.   On: 03/14/2014 10:22   Dg Chest Port 1 View 03/14/2014   CLINICAL DATA:  Preoperative film for patient with a left hip fracture after a fall.  EXAM: PORTABLE CHEST - 1 VIEW  COMPARISON:  None.  FINDINGS: There is cardiomegaly without edema. The aorta is tortuous. Lungs are clear. No pneumothorax or pleural effusion. No focal bony abnormality.  IMPRESSION: Cardiomegaly without acute disease.   Electronically Signed   By: Drusilla Kanner M.D.   On: 03/14/2014 10:35    Results for orders placed during the hospital encounter of 03/14/14  URINALYSIS, ROUTINE W REFLEX MICROSCOPIC      Result Value Ref Range   Color, Urine YELLOW  YELLOW   APPearance CLEAR  CLEAR  Specific Gravity, Urine 1.019  1.005 - 1.030   pH 6.0  5.0 - 8.0   Glucose, UA NEGATIVE  NEGATIVE mg/dL   Hgb urine dipstick NEGATIVE  NEGATIVE   Bilirubin Urine NEGATIVE  NEGATIVE   Ketones, ur NEGATIVE  NEGATIVE mg/dL   Protein, ur NEGATIVE  NEGATIVE mg/dL   Urobilinogen, UA 0.2  0.0 - 1.0 mg/dL   Nitrite NEGATIVE  NEGATIVE   Leukocytes, UA NEGATIVE  NEGATIVE  BASIC METABOLIC PANEL      Result Value Ref Range   Sodium 138  137 - 147 mEq/L   Potassium 3.9  3.7 - 5.3 mEq/L   Chloride 99  96 - 112 mEq/L   CO2 27  19 - 32 mEq/L   Glucose, Bld 145 (*) 70 - 99 mg/dL   BUN 30 (*) 6 - 23 mg/dL   Creatinine, Ser 0.980.85  0.50 - 1.10 mg/dL   Calcium 9.5  8.4 - 11.910.5 mg/dL   GFR calc non Af Amer 59 (*) >90 mL/min   GFR calc Af Amer 68 (*) >90 mL/min   Anion gap 12  5 - 15  CBC WITH DIFFERENTIAL      Result Value Ref Range   WBC 13.3 (*) 4.0 - 10.5 K/uL   RBC 3.66 (*) 3.87 - 5.11 MIL/uL   Hemoglobin 10.1 (*) 12.0 - 15.0 g/dL   HCT 14.731.9 (*) 82.936.0 - 56.246.0 %   MCV  87.2  78.0 - 100.0 fL   MCH 27.6  26.0 - 34.0 pg   MCHC 31.7  30.0 - 36.0 g/dL   RDW 13.013.9  86.511.5 - 78.415.5 %   Platelets 205  150 - 400 K/uL   Neutrophils Relative % 90 (*) 43 - 77 %   Neutro Abs 12.0 (*) 1.7 - 7.7 K/uL   Lymphocytes Relative 6 (*) 12 - 46 %   Lymphs Abs 0.8  0.7 - 4.0 K/uL   Monocytes Relative 4  3 - 12 %   Monocytes Absolute 0.5  0.1 - 1.0 K/uL   Eosinophils Relative 0  0 - 5 %   Eosinophils Absolute 0.0  0.0 - 0.7 K/uL   Basophils Relative 0  0 - 1 %   Basophils Absolute 0.0  0.0 - 0.1 K/uL  TROPONIN I      Result Value Ref Range   Troponin I <0.30  <0.30 ng/mL    1110:  T/C to Ortho Dr. Shelle IronBeane, case discussed, including:  HPI, pertinent PM/SHx, VS/PE, dx testing, ED course and treatment:  Agreeable to consult, requests to keep NPO, continue IVF, OR will be available at 1630 today, and admit to hospitalist service.   1210:  T/C to [facility] Dr. Elisabeth Pigeonevine, case discussed, including:  HPI, pertinent PM/SHx, VS/PE, dx testing, ED course and treatment:  Agreeable to admit, requests to write temporary orders, obtain medical bed to team 8.    Samuel JesterKathleen Davin Muramoto, DO 03/15/14 2017

## 2014-03-14 NOTE — ED Notes (Signed)
Patient medicated for c/o left hip pain, see MAR Patient with hx of dementia and Alzheimer's--unable to give number rating

## 2014-03-14 NOTE — Anesthesia Procedure Notes (Signed)
Procedure Name: Intubation Date/Time: 03/14/2014 6:14 PM Performed by: Edison PaceGRAY, Traver Meckes E Pre-anesthesia Checklist: Patient identified, Timeout performed, Emergency Drugs available, Suction available and Patient being monitored Patient Re-evaluated:Patient Re-evaluated prior to inductionOxygen Delivery Method: Circle system utilized Preoxygenation: Pre-oxygenation with 100% oxygen Intubation Type: IV induction Ventilation: Mask ventilation without difficulty Laryngoscope Size: Mac and 4 Grade View: Grade I Tube type: Oral Tube size: 7.5 mm Number of attempts: 1 Airway Equipment and Method: Stylet Secured at: 21 cm Tube secured with: Tape Dental Injury: Teeth and Oropharynx as per pre-operative assessment

## 2014-03-14 NOTE — ED Notes (Signed)
Portable CXR at bedside.

## 2014-03-14 NOTE — ED Notes (Signed)
MD at bedside. 

## 2014-03-14 NOTE — Transfer of Care (Signed)
Immediate Anesthesia Transfer of Care Note  Patient: Desiree Myers  Procedure(s) Performed: Procedure(s): INTRAMEDULLARY (IM) NAIL FEMORAL (Left)  Patient Location: PACU  Anesthesia Type:General  Level of Consciousness: awake, confused and responds to stimulation  Airway & Oxygen Therapy: Patient Spontanous Breathing and Patient connected to face mask oxygen  Post-op Assessment: Report given to PACU RN, Post -op Vital signs reviewed and stable and Patient moving all extremities  Post vital signs: Reviewed and stable  Complications: No apparent anesthesia complications

## 2014-03-14 NOTE — Anesthesia Preprocedure Evaluation (Addendum)
Anesthesia Evaluation  Patient identified by MRN, date of birth, ID band Patient awake    Reviewed: Allergy & Precautions, H&P , NPO status , Patient's Chart, lab work & pertinent test results  History of Anesthesia Complications Negative for: history of anesthetic complications  Airway Mallampati: III TM Distance: >3 FB Neck ROM: Full    Dental no notable dental hx. (+) Edentulous Upper,    Pulmonary neg pulmonary ROS,  breath sounds clear to auscultation  Pulmonary exam normal       Cardiovascular hypertension, Pt. on medications Rhythm:Regular Rate:Normal     Neuro/Psych Anxiety Depression Dementia CVA, Residual Symptoms negative neurological ROS  negative psych ROS   GI/Hepatic negative GI ROS, Neg liver ROS,   Endo/Other  negative endocrine ROS  Renal/GU negative Renal ROS  negative genitourinary   Musculoskeletal negative musculoskeletal ROS (+)   Abdominal   Peds negative pediatric ROS (+)  Hematology negative hematology ROS (+)   Anesthesia Other Findings   Reproductive/Obstetrics negative OB ROS                         Anesthesia Physical Anesthesia Plan  ASA: III and emergent  Anesthesia Plan: General   Post-op Pain Management:    Induction: Intravenous  Airway Management Planned: Oral ETT  Additional Equipment:   Intra-op Plan:   Post-operative Plan: Extubation in OR and Possible Post-op intubation/ventilation  Informed Consent: I have reviewed the patients History and Physical, chart, labs and discussed the procedure including the risks, benefits and alternatives for the proposed anesthesia with the patient or authorized representative who has indicated his/her understanding and acceptance.   Dental advisory given and Consent reviewed with POA  Plan Discussed with: CRNA  Anesthesia Plan Comments: (Discussed all R/B/O with son Fayrene FearingJames and he is agreeable to  anesthesia plan understanding risks including but not limited to cardiac problems, stroke, aspiration, ventilatory issues postop, injury to lips/teeth/airway, allergic reaction, postoperative delerium, and need for escalation of care. )       Anesthesia Quick Evaluation

## 2014-03-14 NOTE — ED Notes (Signed)
Patient requesting pain medication Will make EDP aware

## 2014-03-14 NOTE — ED Notes (Signed)
Bed: [id number] Expected date:  Expected time:  Means of arrival:  Comments: EMS- 80yo F, L hip/leg pain, hypertensive

## 2014-03-14 NOTE — Consult Note (Signed)
Reason for Consult:Left hip fracture Referring Physician: EDP  Desiree Myers is an 80 y.o. female.  HPI: Larey SeatFell 3 days ago  Past Medical History  Diagnosis Date  . Alzheimer's dementia   . Hypertension   . Glaucoma   . CVA (cerebral infarction)   . Arteriosclerotic cardiovascular disease     History reviewed. No pertinent past surgical history.  History reviewed. No pertinent family history.  Social History:  reports that she has never smoked. She does not have any smokeless tobacco history on file. She reports that she does not drink alcohol or use illicit drugs.  Allergies: No Known Allergies  Medications: I have reviewed the patient's current medications.  No results found for this or any previous visit (from the past 48 hour(s)).  Dg Hip Complete Left  03/14/2014   CLINICAL DATA:  80 year old female with history of trauma from a recent fall at a nursing home complaining of severe left-sided hip pain.  EXAM: LEFT HIP - COMPLETE 2+ VIEW  COMPARISON:  No priors.  FINDINGS: Four views of the bony pelvis and left hip demonstrate an intertrochanteric fracture of the left hip, with mild proximal migration of the distal fracture fragment and varus angulation (approximately 30-40 degrees) at site of fracture. Femoral head remains located in the acetabulum. Overlying soft tissues appear swollen. Bony pelvis itself appears intact, as do the visualized portions of the right proximal femur. Numerous pelvic phleboliths are noted.  IMPRESSION: 1. Mildly displaced, varus angulated and foreshortened intertrochanteric fracture of the left hip.   Electronically Signed   By: Trudie Reedaniel  Entrikin M.D.   On: 03/14/2014 10:22   Dg Chest Port 1 View  03/14/2014   CLINICAL DATA:  Preoperative film for patient with a left hip fracture after a fall.  EXAM: PORTABLE CHEST - 1 VIEW  COMPARISON:  None.  FINDINGS: There is cardiomegaly without edema. The aorta is tortuous. Lungs are clear. No pneumothorax or pleural  effusion. No focal bony abnormality.  IMPRESSION: Cardiomegaly without acute disease.   Electronically Signed   By: Drusilla Kannerhomas  Dalessio M.D.   On: 03/14/2014 10:35    Review of Systems  Musculoskeletal: Positive for joint pain.  All other systems reviewed and are negative.  Blood pressure 181/86, pulse 86, temperature 98.4 F (36.9 C), temperature source Oral, resp. rate 15, SpO2 98.00%. Physical Exam  Vitals reviewed. Constitutional: She is oriented to person, place, and time.  HENT:  Head: Normocephalic.  Eyes: Pupils are equal, round, and reactive to light.  Neck: Normal range of motion.  Cardiovascular: Normal rate.   Respiratory: Effort normal.  GI: Soft.  Musculoskeletal:  Left hip pain with motion. NVI. No DVT  Neurological: She is alert and oriented to person, place, and time.  Skin: Skin is warm and dry.    Assessment/Plan: Left hip IT fracture. Plan IM nailing. Risks discussed.  Suprena Travaglini C 03/14/2014, 11:06 AM

## 2014-03-14 NOTE — ED Notes (Signed)
Patient has pulled out foley with balloon still inflated Patient has also pulled out PIV  EDP made aware Per EDP, foley and PIV to be placed in the OR ED Charge made aware

## 2014-03-14 NOTE — ED Notes (Signed)
Patient to be admitted to hospital OR scheduled for later on this afternoon (approximately 1630[facility])

## 2014-03-14 NOTE — ED Notes (Signed)
Patient transported to X-ray via stretcher Patient in NAD upon leaving for testing 

## 2014-03-14 NOTE — Anesthesia Postprocedure Evaluation (Signed)
  Anesthesia Post-op Note  Patient: Biagio BorgMary Buehring  Procedure(s) Performed: Procedure(s) (LRB): INTRAMEDULLARY (IM) NAIL FEMORAL (Left)  Patient Location: PACU  Anesthesia Type: General  Level of Consciousness: awake and oriented to person, back to baseline mental status as prior to surgery  Airway and Oxygen Therapy: Patient Spontanous Breathing  Post-op Pain: mild  Post-op Assessment: Post-op Vital signs reviewed, Patient's Cardiovascular Status Stable, Respiratory Function Stable, Patent Airway and No signs of Nausea or vomiting  Last Vitals:  Filed Vitals:   03/14/14 1700  BP: 172/86  Pulse:   Temp: 36.8 C  Resp:     Post-op Vital Signs: stable   Complications: No apparent anesthesia complications

## 2014-03-14 NOTE — ED Notes (Signed)
Patient arrives to ED via [facility] due to c/o left hip pain from a fall that occurred three days ago Patient seen and DC from ED a few days ago after fall Nursing home staff called EMS for patient to come to ED for evaluation of left hip pain Bruising noted to left eye from previous fall--no bleeding noted Patient with hx of Alzheimer's and dementia

## 2014-03-14 NOTE — ED Notes (Signed)
Patient back from radiology Per RT, patient with hip fracture ED Charge made aware

## 2014-03-15 ENCOUNTER — Encounter (HOSPITAL_COMMUNITY): Payer: Self-pay | Admitting: Specialist

## 2014-03-15 DIAGNOSIS — S72002S Fracture of unspecified part of neck of left femur, sequela: Secondary | ICD-10-CM

## 2014-03-15 LAB — CBC
HCT: 26.2 % — ABNORMAL LOW (ref 36.0–46.0)
Hemoglobin: 8.3 g/dL — ABNORMAL LOW (ref 12.0–15.0)
MCH: 27.2 pg (ref 26.0–34.0)
MCHC: 31.7 g/dL (ref 30.0–36.0)
MCV: 85.9 fL (ref 78.0–100.0)
Platelets: 177 10*3/uL (ref 150–400)
RBC: 3.05 MIL/uL — ABNORMAL LOW (ref 3.87–5.11)
RDW: 13.8 % (ref 11.5–15.5)
WBC: 10.8 10*3/uL — ABNORMAL HIGH (ref 4.0–10.5)

## 2014-03-15 LAB — URINE CULTURE
COLONY COUNT: NO GROWTH
CULTURE: NO GROWTH

## 2014-03-15 LAB — BASIC METABOLIC PANEL
Anion gap: 12 (ref 5–15)
BUN: 27 mg/dL — ABNORMAL HIGH (ref 6–23)
CO2: 28 mEq/L (ref 19–32)
Calcium: 8.9 mg/dL (ref 8.4–10.5)
Chloride: 100 mEq/L (ref 96–112)
Creatinine, Ser: 0.75 mg/dL (ref 0.50–1.10)
GFR, EST AFRICAN AMERICAN: 84 mL/min — AB (ref >90)
GFR, EST NON AFRICAN AMERICAN: 72 mL/min — AB (ref >90)
Glucose, Bld: 127 mg/dL — ABNORMAL HIGH (ref 70–99)
POTASSIUM: 4.2 meq/L (ref 3.7–5.3)
SODIUM: 140 meq/L (ref 137–147)

## 2014-03-15 MED ORDER — ENSURE COMPLETE PO LIQD
237.0000 mL | Freq: Two times a day (BID) | ORAL | Status: DC
  Administered 2014-03-15 – 2014-03-19 (×6): 237 mL via ORAL

## 2014-03-15 MED ORDER — HEPARIN SODIUM (PORCINE) 5000 UNIT/ML IJ SOLN
5000.0000 [IU] | Freq: Three times a day (TID) | INTRAMUSCULAR | Status: DC
  Administered 2014-03-15 – 2014-03-16 (×3): 5000 [IU] via SUBCUTANEOUS
  Filled 2014-03-15 (×6): qty 1

## 2014-03-15 MED ORDER — FERROUS SULFATE 325 (65 FE) MG PO TABS
325.0000 mg | ORAL_TABLET | Freq: Two times a day (BID) | ORAL | Status: DC
  Administered 2014-03-15 – 2014-03-19 (×4): 325 mg via ORAL
  Filled 2014-03-15 (×10): qty 1

## 2014-03-15 MED ORDER — SODIUM CHLORIDE 0.9 % IV SOLN: INTRAVENOUS | Status: DC

## 2014-03-15 NOTE — Care Management Note (Signed)
[facility]. Resumption of care order requested upon discharge.

## 2014-03-15 NOTE — Progress Notes (Signed)
Pt noted to be pushing food forward when chewing.  Md notified and orders received to swallow eval by speech.

## 2014-03-15 NOTE — Progress Notes (Signed)
OT Cancellation Note  Patient Details Name: Biagio BorgMary Dolberry MRN: [id number] DOB: September 09, 1920   Cancelled Treatment:    Reason Eval/Treat Not Completed: Other (comment)  Noted pt has dementia and is planning SNF for rehab.  Will defer OT eval to that venue.  Kristopher Attwood 03/15/2014, 3:54 PM Marica OtterMaryellen Dene Nazir, OTR/L [phone] 03/15/2014

## 2014-03-15 NOTE — Progress Notes (Signed)
Pt pulled her foley cath out at 0530am (per report). Pt was bladder at 1240pm with 167ml noted in bladder. Dr. Seth BakeElgergaway, MD notified. Obtained order to bladder scan Q 6hr and encourage oral fluid intake. Will continue to monitor pt.

## 2014-03-15 NOTE — Plan of Care (Signed)
Problem: Consults Goal: Hip/Femur Fracture Patient Education See Patient Education Module for education specifics.  Outcome: Not Met (add Reason) Pt cnofused

## 2014-03-15 NOTE — Evaluation (Signed)
Physical Therapy Evaluation Patient Details Name: Desiree BorgMary Vallecillo MRN: [id number] DOB: 27-Jun-1920 Today's Date: 03/15/2014   History of Present Illness     Clinical Impression  Pt s/p L hip fx with IM nailing repair presents with decreased L LE strength/ROM, post op pain, PWB status and dementia limiting functional mobility.  Pt will require follow up rehab at SNF level.    Follow Up Recommendations SNF    Equipment Recommendations  None recommended by PT    Recommendations for Other Services       Precautions / Restrictions Precautions Precautions: Fall Restrictions Weight Bearing Restrictions: Yes LLE Weight Bearing: Partial weight bearing      Mobility  Bed Mobility Overal bed mobility: +2 for physical assistance;Needs Assistance;+ 2 for safety/equipment Bed Mobility: Supine to Sit;Sit to Supine     Supine to sit: Max assist;+2 for physical assistance Sit to supine: Max assist;+2 for physical assistance   General bed mobility comments: cues for sequence and use of R LE to self assist with limited participation by pt  Transfers Overall transfer level: Needs assistance Equipment used: Rolling walker (2 wheeled) Transfers: Sit to/from Stand Sit to Stand: Max assist;+2 physical assistance;+2 safety/equipment         General transfer comment: Acheived semi-erect standing.  Pt becoming increasingly afraid of falling and pushing posteriorly.  Returned to sitting  Ambulation/Gait                Stairs            Wheelchair Mobility    Modified Rankin (Stroke Patients Only)       Balance                                             Pertinent Vitals/Pain Pain Assessment: Faces Faces Pain Scale: Hurts little more Pain Location: L hip Pain Intervention(s): Limited activity within patient's tolerance;Monitored during session;Premedicated before session;Ice applied    Home Living Family/patient expects to be discharged to::  Skilled nursing facility                      Prior Function Level of Independence: Needs assistance         Comments: pt unable to provide reliable information 2* dementia     Hand Dominance        Extremity/Trunk Assessment   Upper Extremity Assessment: Generalized weakness           Lower Extremity Assessment: RLE deficits/detail;LLE deficits/detail RLE Deficits / Details: Generalized weakness LLE Deficits / Details: AAROM at hip to 80 flex and 15 abd wtih ~2/5 strength based on pts intermittent participation with movement  Cervical / Trunk Assessment: Kyphotic  Communication   Communication: No difficulties  Cognition Arousal/Alertness: Awake/alert Behavior During Therapy: WFL for tasks assessed/performed Overall Cognitive Status: History of cognitive impairments - at baseline         Following Commands: Follows one step commands inconsistently Safety/Judgement: Decreased awareness of safety;Decreased awareness of deficits   Problem Solving: Slow processing;Decreased initiation      General Comments      Exercises General Exercises - Lower Extremity Ankle Circles/Pumps: AAROM;Both;15 reps;Supine Heel Slides: AAROM;Left;15 reps;Supine Hip ABduction/ADduction: AAROM;Left;10 reps;Supine      Assessment/Plan    PT Assessment Patient needs continued PT services  PT Diagnosis Difficulty walking   PT Problem List Decreased strength;Decreased range of  motion;Decreased activity tolerance;Decreased balance;Decreased mobility;Decreased cognition;Decreased safety awareness;Decreased knowledge of use of DME;Decreased knowledge of precautions;Pain  PT Treatment Interventions DME instruction;Gait training;Functional mobility training;Therapeutic activities;Therapeutic exercise;Balance training;Patient/family education   PT Goals (Current goals can be found in the Care Plan section) Acute Rehab PT Goals PT Goal Formulation: Patient unable to participate in  goal setting Time For Goal Achievement: 03/29/14 Potential to Achieve Goals: Fair    Frequency Min 3X/week   Barriers to discharge        Co-evaluation               End of Session Equipment Utilized During Treatment: Gait belt Activity Tolerance: Other (comment);Patient limited by fatigue (Fear of falling) Patient left: in bed;with bed alarm set;with call bell/phone within reach Nurse Communication: Mobility status         Time: [id number] PT Time Calculation (min): 25 min   Charges:   PT Evaluation $Initial PT Evaluation Tier I: 1 Procedure PT Treatments $Therapeutic Exercise: 8-22 mins   PT G Codes:          Jaidence Geisler 03/15/2014, 5:31 PM

## 2014-03-15 NOTE — Progress Notes (Addendum)
Clinical Social Work Department CLINICAL SOCIAL WORK PLACEMENT NOTE 03/15/2014  Patient:  Desiree Myers,Evva  Account Number:  [id number] Admit date:  03/14/2014  Clinical Social Worker:  Jacelyn GripSUZANNA BYRD, LCSWA  Date/time:  03/15/2014 01:46 PM  Clinical Social Work is seeking post-discharge placement for this patient at the following level of care:   SKILLED NURSING   (*CSW will update this form in Epic as items are completed)   03/15/2014  Patient/family provided with [facility] Department of Clinical Social Work's list of facilities offering this level of care within the geographic area requested by the patient (or if unable, by the patient's family).  03/15/2014  Patient/family informed of their freedom to choose among providers that offer the needed level of care, that participate in Medicare, Medicaid or managed care program needed by the patient, have an available bed and are willing to accept the patient.  03/15/2014  Patient/family informed of [facility]' ownership interest in [facility], as well as of the fact that they are under no obligation to receive care at this facility.  PASARR submitted to EDS on 03/15/2014 PASARR number received on 03/15/2014  FL2 transmitted to all facilities in geographic area requested by pt/family on  03/15/2014 FL2 transmitted to all facilities within larger geographic area on   Patient informed that his/her managed care company has contracts with or will negotiate with  certain facilities, including the following:     Patient/family informed of bed offers received:  03/18/2014 [facility] Physician recommends and patient chooses bed at    [facility] on  03/19/14 Patient to be transferred to facility by [facility] Patient and family notified of transfer on 03/19/14 Name of family member notified:  Janet BerlinJames-son via phone  The following  physician request were entered in Epic:   Additional Comments:   Loletta SpecterSuzanna Kidd, MSW, LCSW Clinical Social Work Coverage for [employer] Haidinger, [place] [phone]

## 2014-03-15 NOTE — Evaluation (Signed)
Clinical/Bedside Swallow Evaluation Patient Details  Name: Desiree BorgMary Myers MRN: [id number] Date of Birth: 13-Jun-1920  Today's Date: 03/15/2014 Time: [id number] SLP Time Calculation (min): 56 min  Past Medical History:  Past Medical History  Diagnosis Date  . Alzheimer's dementia   . Hypertension   . Glaucoma   . CVA (cerebral infarction)   . Arteriosclerotic cardiovascular disease   . Stroke    Past Surgical History:  Past Surgical History  Procedure Laterality Date  . Femur im nail Left 03/14/2014    Procedure: INTRAMEDULLARY (IM) NAIL FEMORAL;  Surgeon: Javier DockerJeffrey C Beane, MD;  Location: [facility];  Service: Orthopedics;  Laterality: Left;   HPI:  80 y.o. Female, resident of SNF, [facility] after an episode of fall at the facility. Please note that pt is unable to provide much history due to dementia. Per records, pt apparently complained of left hip pain 3 days prior to this admission and was noted to have contusion on the left lateral forehead. It was assumed she fell. She continued to complain of left hip pain and was brought in for further evaluation. There was no reported chest pain, shortness of breath, no reported abd or urinary concerns, no reported fevers, chills.   Assessment / Plan / Recommendation Clinical Impression  Pt presents with a mild-moderate oral phase dysphagia characteristic of demetia, with decreased mastication, and use of vertical chewing (munching pattern) rather than rotary motion of the jaw.  Pt also holds the bolus orally, requiring cues to "swallow."  Pharyngeally, swallow appears to be intact.  No overt s/s of aspiration were observed.  LS are diminished; CXR reveals cardiomegly, but no active disease.  Pt repeated, "I'm scared.  Please don'Myers leave me by myself."  Reassusred pt that she is safe and will be well taken care of.    Aspiration Risk  Mild    Diet Recommendation Dysphagia 2 (Fine chop);Thin liquid   Liquid Administration via:  Cup;Straw Medication Administration: Whole meds with puree Supervision: Staff to assist with self feeding;Full supervision/cueing for compensatory strategies Compensations: Slow rate;Small sips/bites;Check for pocketing Postural Changes and/or Swallow Maneuvers: Seated upright 90 degrees    Other  Recommendations Oral Care Recommendations: Oral care BID Other Recommendations: Clarify dietary restrictions   Follow Up Recommendations  Skilled Nursing facility    Frequency and Duration min 2x/week      Pertinent Vitals/Pain CXR NAD    SLP Swallow Goals     Swallow Study Prior Functional Status       General HPI: 80 y.o. Female, resident of SNF, [facility] after an episode of fall at the facility. Please note that pt is unable to provide much history due to dementia. Per records, pt apparently complained of left hip pain 3 days prior to this admission and was noted to have contusion on the left lateral forehead. It was assumed she fell. She continued to complain of left hip pain and was brought in for further evaluation. There was no reported chest pain, shortness of breath, no reported abd or urinary concerns, no reported fevers, chills. Type of Study: Bedside swallow evaluation Previous Swallow Assessment: None found Diet Prior to this Study: Dysphagia 3 (soft);Thin liquids Temperature Spikes Noted: No Respiratory Status: Nasal cannula History of Recent Intubation: No Behavior/Cognition: Alert;Cooperative;Confused;Distractible;Requires cueing;Decreased sustained attention Oral Cavity - Dentition: Adequate natural dentition Self-Feeding Abilities: Able to feed self;Needs set up Patient Positioning: Upright in bed Baseline Vocal Quality: Clear Volitional Cough: Weak Volitional  Swallow: Unable to elicit    Oral/Motor/Sensory Function Overall Oral Motor/Sensory Function: Appears within functional limits for tasks assessed   Ice Chips Ice chips: Within functional  limits Presentation: Spoon   Thin Liquid Thin Liquid: Within functional limits Presentation: Spoon;Cup;Straw    Nectar Thick Nectar Thick Liquid: Not tested   Honey Thick Honey Thick Liquid: Not tested   Puree Puree: Within functional limits Presentation: Spoon   Solid   GO    Solid: Impaired Presentation: Self Fed Oral Phase Impairments: Impaired anterior to posterior transit;Impaired mastication Oral Phase Functional Implications: Oral residue;Oral holding       Desiree Myers, Desiree Myers 03/15/2014,5:17 PM

## 2014-03-15 NOTE — Op Note (Signed)
NAMBiagio Borg:  Abdon, Korrine                ACCOUNT NO.:  [id number]  MEDICAL RECORD NO.:  [id number]  LOCATION:  1612                         [facility]  PHYSICIAN:  Jene EveryJeffrey Timithy Arons, M.D.    DATE OF BIRTH:  1921/02/28  DATE OF PROCEDURE:  03/14/2014 DATE OF DISCHARGE:                              OPERATIVE REPORT   PREOPERATIVE DIAGNOSIS:  Left intertrochanteric hip fracture, displaced.  POSTOPERATIVE DIAGNOSIS:  Left intertrochanteric hip fracture, displaced.  PROCEDURE PERFORMED:  Intramedullary nailing, left hip and femur.  ANESTHESIA:  General.  ASSISTANT:  No assistant.  HISTORY:  She is 89, fell 3 days ago, displaced intertroch, indicated for open reduction and internal fixation.  Risks and benefits discussed including bleeding, infection, DVT, PE, malunion, nonunion, need for revision, etc.  TECHNIQUE:  With the patient in a supine position, after induction of adequate general anesthesia, 2 g Kefzol, placed on the fracture table. All bony prominences were well padded.  Peroneal post padded.  Foley to gravity.  Well leg in gentle flexion, external rotation.  Left lower extremity, longitudinal traction.  Severe flexion contracture of the knee was taken into account.  Left hip and peritrochanteric region was prepped and draped in usual sterile fashion.  Incision was made proximal to the trochanter, subcutaneous tissue was dissected.  Electrocautery was utilized to achieve hemostasis.  Had reduced it under x-ray.  It was fairly comminuted.  Guide pin was placed in the femoral head into the intramedullary canal under x-ray.  I chose an 11, 130-degree angle.  We over reamed it proximally, put a 11 short nail under x-ray holding up on the greater trochanter.  I advanced the guide pin in the femoral head and neck after a stab incision in the lateral skin on the thigh using the external alignment guide.  Guide pin up into the femoral head and neck measured at a 95, reamed to 95.   Placed a compression screw 95, compressed it at the fracture site with reduction of the fracture.  I placed a derotation screw in as well after drilling and insertion of 75 mm length screw in soft bone.  In AP and lateral plane, it was satisfactory.  Moderate amount of hematoma proximally.  Then using the external alignment guide and a distal locking screw in the static hole, stab incision in the lateral thigh, drilled it bicortically and inserted a 38 mm screw with excellent purchase.  In the AP and lateral plane, it was satisfactory, I locked it down proximally.  I removed the external alignment guide, obtained x-rays, which were satisfactory.  Wound copiously irrigated with saline and antibiotic irrigation and electrocautery was utilized to achieve hemostasis.  We closed the deep fascia with 0-Vicryl, subcu with 2-0, and skin with staples.  Wound was dressed sterilely.  Traction was released prior to compression of the fracture site.  Sterile dressing was applied.  She was awoken without difficulty and transported to the recovery room in satisfactory condition.  The patient tolerated the procedure well.  No complications.  No assistant.  Blood loss 200 mL.     Jene EveryJeffrey Willaim Mode, M.D.     Cordelia PenJB/MEDQ  D:  03/14/2014  T:  03/15/2014  Job:  [id number]

## 2014-03-15 NOTE — Progress Notes (Signed)
Subjective: 1 Day Post-Op Procedure(s) (LRB): INTRAMEDULLARY (IM) NAIL FEMORAL (Left) Patient reports pain as mild. Hip pain well controlled this AM.  Objective: Vital signs in last 24 hours: Temp:  [97.4 F (36.3 C)-99.1 F (37.3 C)] 97.5 F (36.4 C) (10/16 0543) Pulse Rate:  [62-106] 81 (10/16 0543) Resp:  [12-24] 16 (10/16 0543) BP: (135-204)/(52-116) 135/52 mmHg (10/16 0543) SpO2:  [90 %-100 %] 96 % (10/16 0543)  Intake/Output from previous day: 10/15 0701 - 10/16 0700 In: 2677.5 [I.V.:2577.5; IV Piggyback:100] Out: 785 [Urine:585; Blood:200] Intake/Output this shift:     Recent Labs  03/14/14 1050 03/15/14 0536  HGB 10.1* 8.3*    Recent Labs  03/14/14 1050 03/15/14 0536  WBC 13.3* 10.8*  RBC 3.66* 3.05*  HCT 31.9* 26.2*  PLT 205 177    Recent Labs  03/14/14 1050 03/15/14 0536  NA 138 140  K 3.9 4.2  CL 99 100  CO2 27 28  BUN 30* 27*  CREATININE 0.85 0.75  GLUCOSE 145* 127*  CALCIUM 9.5 8.9   No results found for this basename: LABPT, INR,  in the last 72 hours  Neurologically intact ABD soft Neurovascular intact Sensation intact distally Intact pulses distally Dorsiflexion/Plantar flexion intact Incision: dressing C/D/I and scant drainage No cellulitis present Compartment soft no calf pain or sign of DVT  Assessment/Plan: 1 Day Post-Op Procedure(s) (LRB): INTRAMEDULLARY (IM) NAIL FEMORAL (Left) Advance diet Up with therapy D/C IV fluids PWB LLE 50% Eventual D/C to likely SNF when medically stable per admitting service  Desiree Myers, Desiree Nop M. 03/15/2014, 7:55 AM

## 2014-03-15 NOTE — Progress Notes (Signed)
Clinical Social Work Department BRIEF PSYCHOSOCIAL ASSESSMENT 03/15/2014  Patient:  Desiree Myers,Desiree Myers     Account Number:  [id number]     Admit date:  03/14/2014  Clinical Social Worker:  Jacelyn GripBYRD,Belicia Difatta, LCSWA  Date/Time:  03/15/2014 01:38 PM  Referred by:  Physician  Date Referred:  03/15/2014 Referred for  SNF Placement   Other Referral:   Interview type:  Family Other interview type:    PSYCHOSOCIAL DATA Living Status:  FACILITY [facility] Level of care:  Assisted Living Primary support name:  Desiree Myers/son/[phone] Primary support relationship to patient:  CHILD, ADULT Degree of support available:   adequate    CURRENT CONCERNS Current Concerns  Post-Acute Placement   Other Concerns:    SOCIAL WORK ASSESSMENT / PLAN CSW received referral [facility] and pt will need SNF upon discharge.    CSW reviewed chart and spoke with RN. Pt with dementia and unable to participate in assessment. CSW contacted pt son, Desiree FearingJames via telephone. CSW introduced self and explained role. CSW discussed that pt will need SNF upon discharge secondary to hip fracture. Pt son expressed understanding and agreeable [place] [facility] [place]. Pt son shared that he is not that familiar [place] and CSW encouraged pt son to discuss with pt ALF as pt ALF will likely be able to assist. Pt son provided email address for CSW to send list of skilled nursing facilities. CSW Engineer, productionemailed list.    CSW completed FL2 and [place] [facility] [place] [facility].    CSW to follow up with pt son re: SNF bed offers.    CSW to continue to follow to assist with pt discharge planning needs.   Assessment/plan status:  Psychosocial Support/Ongoing Assessment of Needs Other assessment/ plan:   discharge planning   Information/referral to community resources:   [facility] [place] [facility] [place]     PATIENT'S/FAMILY'S RESPONSE TO PLAN OF CARE: Pt alert and oriented to person only. Pt son agreeable to SNF for rehab for pt. Pt son unfamilar with SNF facilities, but plans to review list.   [name], MSW, LCSW Clinical Social Work Coverage [place], [place] [phone]

## 2014-03-15 NOTE — Progress Notes (Addendum)
Pt still did not void, Dr Randol KernElgergawy was notified, new orders for IV NS at 7750ml/hr. Bladder scanned 207ml noted

## 2014-03-15 NOTE — Progress Notes (Signed)
Patient Demographics  Desiree Myers, is a 80 y.o. female, DOB - June 26, 1920, [id number]  Admit date - 03/14/2014   Admitting Physician Alison Murray, MD  Outpatient Primary MD for the patient is Ron Parker, MD  LOS - 1   Chief Complaint  Patient presents with  . Fall  . Hip Pain    Left side        Subjective:   Biagio Borg today has, No headache, No chest pain, No abdominal pain - No Nausea, No new weakness tingling or numbness, No Cough - SOB.   Assessment & Plan    Principal Problem:   Closed left hip fracture Active Problems:   HTN (hypertension)   Leukocytosis   Alzheimer's dementia   Closed left hip fracture  - status post intramedullary nail fixation 10/50  -continue with when necessary pain medicine, -await PT/OT recommendation  HTN (hypertension)  -continue lisinopril  -monitor closely and readjust the regimen as indicated   Leukocytosis  -appears to be secondary to demargination/stress reaction secondary to hip fracture  -no clear infectious etiology, UA and CXR unremarkable for an infectious etiology  -No indication for antibiotics -resolvedresolved Alzheimer's dementia  -will go back to SNF once medically stable Anemia of chronic disease  -no signs of active bleeding   -mild drop after surgery, will start on iron supplement.  Code Status:Full  Family Communication: No family at bedside.  Disposition Plan: SNF   Procedures  INTRAMEDULLARY (IM) NAIL FEMORAL (Left) 10/15   Consults   Orhtopedics.   Medications  Scheduled Meds: . amLODipine  5 mg Oral Daily  . aspirin  81 mg Oral Daily  . aspirin EC  325 mg Oral Q breakfast  . divalproex  125 mg Oral BID  . docusate sodium  100 mg Oral BID  . feeding supplement (ENSURE COMPLETE)  237 mL Oral BID BM  . lanolin/mineral oil  1 application Topical QHS  .  latanoprost  1 drop Both Eyes QHS  . lisinopril  10 mg Oral Daily  . senna  1 tablet Oral QHS   Continuous Infusions: . sodium chloride Stopped (03/15/14 0700)   PRN Meds:.acetaminophen, acetaminophen, acetaminophen, albuterol, bisacodyl, hydrALAZINE, HYDROcodone-acetaminophen, HYDROcodone-acetaminophen, menthol-cetylpyridinium, metoCLOPramide (REGLAN) injection, metoCLOPramide, morphine injection, morphine injection, neomycin-bacitracin-polymyxin, ondansetron (ZOFRAN) IV, ondansetron, phenol, senna-docusate  DVT Prophylaxis Heparin - SCDs   Lab Results  Component Value Date   PLT 177 03/15/2014    Antibiotics    Anti-infectives   Start     Dose/Rate Route Frequency Ordered Stop   03/15/14 0000  ceFAZolin (ANCEF) IVPB 2 g/50 mL premix     2 g 100 mL/hr over 30 Minutes Intravenous Every 6 hours 03/14/14 2121 03/15/14 1259   03/14/14 1938  polymyxin B 500,000 Units, bacitracin 50,000 Units in sodium chloride irrigation 0.9 % 500 mL irrigation  Status:  Discontinued       As needed 03/14/14 1938 03/14/14 1954   03/14/14 1300  ceFAZolin (ANCEF) IVPB 2 g/50 mL premix  Status:  Discontinued     2 g 100 mL/hr over 30 Minutes Intravenous To Surgery 03/14/14 1253 03/14/14 1710          Objective:   Filed Vitals:   03/15/14 0058 03/15/14 0543 03/15/14 1055 03/15/14  1103  BP: 154/70 135/52 146/59   Pulse: 72 81 79   Temp: 97.5 F (36.4 C) 97.5 F (36.4 C) 98.2 F (36.8 C)   TempSrc: Oral Oral Oral   Resp: 14 16 20    SpO2: 90% 96% 85% 95%    Wt Readings from Last 3 Encounters:  No data found for Wt     Intake/Output Summary (Last 24 hours) at 03/15/14 1310 Last data filed at 03/15/14 0912  Gross per 24 hour  Intake 2737.5 ml  Output    785 ml  Net 1952.5 ml     Physical Exam  Awake Alert, No new F.N deficits, Normal affect [place].AT,PERRAL Supple Neck,No JVD, No cervical lymphadenopathy appriciated.  Symmetrical Chest wall movement, Good air movement bilaterally,  CTAB RRR,No Gallops,Rubs or new Murmurs, No Parasternal Heave +ve B.Sounds, Abd Soft, No tenderness, No organomegaly appriciated, No rebound - guarding or rigidity. No Cyanosis, Clubbing or edema, No new Rash or bruise     Data Review   Micro Results Recent Results (from the past 240 hour(s))  URINE CULTURE     Status: None   Collection Time    03/14/14 10:40 AM      Result Value Ref Range Status   Specimen Description URINE, CATHETERIZED   Final   Special Requests NONE   Final   Culture  Setup Time     Final   Value: 03/14/2014 14:16     Performed at [facility] Count     Final   Value: NO GROWTH     Performed at [facility]   Culture     Final   Value: NO GROWTH     Performed at [facility]   Report Status 03/15/2014 FINAL   Final  SURGICAL PCR SCREEN     Status: None   Collection Time    03/14/14  4:05 PM      Result Value Ref Range Status   MRSA, PCR NEGATIVE  NEGATIVE Final   Staphylococcus aureus NEGATIVE  NEGATIVE Final   Comment:            The Xpert SA Assay (FDA     approved for NASAL specimens     in patients over 38 years of age),     is one component of     a comprehensive surveillance     program.  Test performance has     been validated by [employer] for patients greater     than or equal to 78 year old.     It is not intended     to diagnose infection nor to     guide or monitor treatment.    Radiology Reports Dg Hip Complete Left  03/14/2014   CLINICAL DATA:  80 year old female with history of trauma from a recent fall at a nursing home complaining of severe left-sided hip pain.  EXAM: LEFT HIP - COMPLETE 2+ VIEW  COMPARISON:  No priors.  FINDINGS: Four views of the bony pelvis and left hip demonstrate an intertrochanteric fracture of the left hip, with mild proximal migration of the distal fracture fragment and varus angulation (approximately 30-40 degrees) at site of fracture. Femoral head remains located in  the acetabulum. Overlying soft tissues appear swollen. Bony pelvis itself appears intact, as do the visualized portions of the right proximal femur. Numerous pelvic phleboliths are noted.  IMPRESSION: 1. Mildly displaced, varus angulated and foreshortened intertrochanteric fracture  of the left hip.   Electronically Signed   By: Trudie Reedaniel  Entrikin M.D.   On: 03/14/2014 10:22   Dg Hip Operative Left  03/15/2014   CLINICAL DATA:  Open reduction internal fixation for fracture  EXAM: OPERATIVE LEFT HIP  COMPARISON:  March 14, 2014  FINDINGS: Frontal and lateral views were obtained. There is screw and rod fixation through the intertrochanteric femur fracture on the left. Overall alignment is near anatomic. The tips of the screws are in the proximal femoral head. No dislocation.  IMPRESSION: Alignment at fracture site near anatomic.  No dislocation.   Electronically Signed   By: Bretta BangWilliam  Woodruff M.D.   On: 03/15/2014 08:47   Dg Pelvis Portable  03/14/2014   CLINICAL DATA:  Postoperative evaluation of LEFT hip.  EXAM: PORTABLE PELVIS 1-2 VIEWS  COMPARISON:  LEFT hip radiograph March 14, 2014 at 10:08 a.m.  FINDINGS: Intra medullary rod and 2 pins through the femoral neck transfix comminuted LEFT femur intertrochanteric fracture. Fragments appear in alignment on this single frontal radiograph. No dislocation. No destructive bony lesions  Phleboliths and moderate amount of stool project in the pelvis. LEFT hip subcutaneous gas with overlying skin staples consistent with recent surgery.  IMPRESSION: Status post interval LEFT femur ORIF for intertrochanteric fracture with improved alignment on this single frontal radiograph.   Electronically Signed   By: Awilda Metroourtnay  Bloomer   On: 03/14/2014 20:56   Dg Chest Port 1 View  03/14/2014   CLINICAL DATA:  Preoperative film for patient with a left hip fracture after a fall.  EXAM: PORTABLE CHEST - 1 VIEW  COMPARISON:  None.  FINDINGS: There is cardiomegaly without edema.  The aorta is tortuous. Lungs are clear. No pneumothorax or pleural effusion. No focal bony abnormality.  IMPRESSION: Cardiomegaly without acute disease.   Electronically Signed   By: Drusilla Kannerhomas  Dalessio M.D.   On: 03/14/2014 10:35    CBC  Recent Labs Lab 03/14/14 1050 03/15/14 0536  WBC 13.3* 10.8*  HGB 10.1* 8.3*  HCT 31.9* 26.2*  PLT 205 177  MCV 87.2 85.9  MCH 27.6 27.2  MCHC 31.7 31.7  RDW 13.9 13.8  LYMPHSABS 0.8  --   MONOABS 0.5  --   EOSABS 0.0  --   BASOSABS 0.0  --     Chemistries   Recent Labs Lab 03/14/14 1050 03/15/14 0536  NA 138 140  K 3.9 4.2  CL 99 100  CO2 27 28  GLUCOSE 145* 127*  BUN 30* 27*  CREATININE 0.85 0.75  CALCIUM 9.5 8.9   ------------------------------------------------------------------------------------------------------------------ CrCl is unknown because there is no height on file for the current visit. ------------------------------------------------------------------------------------------------------------------ No results found for this basename: HGBA1C,  in the last 72 hours ------------------------------------------------------------------------------------------------------------------ No results found for this basename: CHOL, HDL, LDLCALC, TRIG, CHOLHDL, LDLDIRECT,  in the last 72 hours ------------------------------------------------------------------------------------------------------------------ No results found for this basename: TSH, T4TOTAL, FREET3, T3FREE, THYROIDAB,  in the last 72 hours ------------------------------------------------------------------------------------------------------------------ No results found for this basename: VITAMINB12, FOLATE, FERRITIN, TIBC, IRON, RETICCTPCT,  in the last 72 hours  Coagulation profile No results found for this basename: INR, PROTIME,  in the last 168 hours  No results found for this basename: DDIMER,  in the last 72 hours  Cardiac Enzymes  Recent Labs Lab  03/14/14 1050  TROPONINI <0.30   ------------------------------------------------------------------------------------------------------------------ No components found with this basename: POCBNP,      Time Spent in minutes   25 minutes    Nija Koopman M.D on 03/15/2014  at 1:10 PM  Between 7am to 7pm - Pager - [phone]  After 7pm go to [email or web address] - password [email or web address]  And look for the night coverage person covering for me after hours  [facility] Office  [phone]   **Disclaimer: This note may have been dictated with voice recognition software. Similar sounding words can inadvertently be transcribed and this note may contain transcription errors which may not have been corrected upon publication of note.**

## 2014-03-15 NOTE — Progress Notes (Signed)
INITIAL NUTRITION ASSESSMENT  DOCUMENTATION CODES Per approved criteria  -Non-severe (moderate) malnutrition in the context of chronic illness   INTERVENTION:  Patient for SLP swallow eval  Downgrade diet to Dysphagia 3 chopped meats with chopped meats, non select Ensure Complete po BID, each supplement provides 350 kcal and 13 grams of protein RD to follow  NUTRITION DIAGNOSIS: Inadequate oral intake related to dementia as evidenced by staff report.   Goal: Intake of meals and supplements to meet >90% estimated needs.  Monitor:  Intake, diet tolerance, labs, weight trend  Reason for Assessment: Consult-hip fx  80 y.o. female  Admitting Dx: Closed left hip fracture  ASSESSMENT: Patient s/p hip fracture post op day 1.  Hx includes dementia. [facility] [phone].  They reported that patient usually has a good intake, recent weight loss of 2 lbs with weight of 109 lbs.  Height unknown.  Diet was chopped meats. Currently, patient with decreased intake on regular diet.  She has consult for a swallow evaluation secondary to pushing food forward when chewing.  She will take a bite then say that she is not hungry but if staff continues to go in to feed her she has forgotten they were just there and continue to eat.  Nutrition Focused Physical Exam:  Subcutaneous Fat:  Orbital Region: mild Upper Arm Region: severe Thoracic and Lumbar Region: na/  Muscle:  Temple Region: moderate to severe Clavicle Bone Region: moderate Clavicle and Acromion Bone Region: mild Scapular Bone Region: moderate Dorsal Hand: severe Patellar Region: wnl Anterior Thigh Region: mild/moderate Posterior Calf Region: mild/moderate  Edema: not noted   Height: Ht Readings from Last 1 Encounters:  No data found for Ht    Weight: Wt Readings from Last 1 Encounters:  No data found for [employer]t    Ideal Body Weight: unknown  % Ideal Body Weight: unknown  Wt Readings from Last  10 Encounters:  No data found for Wt    Usual Body Weight: 111 lbs  % Usual Body Weight: unknown  BMI:  There is no height or weight on file to calculate BMI.  Estimated Nutritional Needs: Kcal: 1300-1400 kcal Protein: 50-60 gm Fluid: >/=1.3L  Skin: incision  Diet Order: Regular  EDUCATION NEEDS: -No education needs identified at this time   Intake/Output Summary (Last 24 hours) at 03/15/14 1151 Last data filed at 03/15/14 0912  Gross per 24 hour  Intake 2737.5 ml  Output    785 ml  Net 1952.5 ml     Labs:   Recent Labs Lab 03/14/14 1050 03/15/14 0536  NA 138 140  K 3.9 4.2  CL 99 100  CO2 27 28  BUN 30* 27*  CREATININE 0.85 0.75  CALCIUM 9.5 8.9  GLUCOSE 145* 127*    CBG (last 3)  No results found for this basename: GLUCAP,  in the last 72 hours  Scheduled Meds: . sodium chloride   Intravenous STAT  . amLODipine  5 mg Oral Daily  . aspirin  81 mg Oral Daily  . aspirin EC  325 mg Oral Q breakfast  .  ceFAZolin (ANCEF) IV  2 g Intravenous Q6H  . divalproex  125 mg Oral BID  . docusate sodium  100 mg Oral BID  . feeding supplement (ENSURE COMPLETE)  237 mL Oral BID BM  . lanolin/mineral oil  1 application Topical QHS  . latanoprost  1 drop Both Eyes QHS  . lisinopril  10 mg Oral Daily  . senna  1 tablet Oral QHS    Continuous Infusions: . sodium chloride Stopped (03/15/14 0700)    Past Medical History  Diagnosis Date  . Alzheimer's dementia   . Hypertension   . Glaucoma   . CVA (cerebral infarction)   . Arteriosclerotic cardiovascular disease   . Stroke     History reviewed. No pertinent past surgical history.  Oran ReinLaura Demita Tobia, RD, LDN Clinical Inpatient Dietitian Pager:  [phone] Weekend and after hours pager:  [phone]

## 2014-03-16 ENCOUNTER — Inpatient Hospital Stay (HOSPITAL_COMMUNITY): Payer: Medicare Other

## 2014-03-16 ENCOUNTER — Encounter (HOSPITAL_COMMUNITY): Payer: Self-pay | Admitting: Radiology

## 2014-03-16 LAB — CBC
HCT: 21.5 % — ABNORMAL LOW (ref 36.0–46.0)
Hemoglobin: 7 g/dL — ABNORMAL LOW (ref 12.0–15.0)
MCH: 28.2 pg (ref 26.0–34.0)
MCHC: 32.6 g/dL (ref 30.0–36.0)
MCV: 86.7 fL (ref 78.0–100.0)
PLATELETS: 155 10*3/uL (ref 150–400)
RBC: 2.48 MIL/uL — AB (ref 3.87–5.11)
RDW: 14 % (ref 11.5–15.5)
WBC: 10.2 10*3/uL (ref 4.0–10.5)

## 2014-03-16 LAB — BASIC METABOLIC PANEL
ANION GAP: 10 (ref 5–15)
BUN: 33 mg/dL — ABNORMAL HIGH (ref 6–23)
CALCIUM: 8.9 mg/dL (ref 8.4–10.5)
CHLORIDE: 100 meq/L (ref 96–112)
CO2: 29 meq/L (ref 19–32)
Creatinine, Ser: 0.88 mg/dL (ref 0.50–1.10)
GFR calc Af Amer: 65 mL/min — ABNORMAL LOW (ref >90)
GFR calc non Af Amer: 56 mL/min — ABNORMAL LOW (ref >90)
Glucose, Bld: 119 mg/dL — ABNORMAL HIGH (ref 70–99)
Potassium: 4.5 mEq/L (ref 3.7–5.3)
Sodium: 139 mEq/L (ref 137–147)

## 2014-03-16 LAB — TROPONIN I: Troponin I: <0.3 ng/mL (ref <0.30)

## 2014-03-16 LAB — PREPARE RBC (CROSSMATCH)

## 2014-03-16 MED ORDER — SODIUM CHLORIDE 0.9 % IV SOLN: Freq: Once | INTRAVENOUS | Status: DC

## 2014-03-16 NOTE — Progress Notes (Signed)
Subjective: 2 Days Post-Op Procedure(s) (LRB): INTRAMEDULLARY (IM) NAIL FEMORAL (Left) Patient reports pain as  Awakens   responsive Objective: Vital signs in last 24 hours: Temp:  [97.9 F (36.6 C)-100 F (37.8 C)] 97.9 F (36.6 C) (10/17 0536) Pulse Rate:  [45-96] 45 (10/17 0536) Resp:  [14-20] 15 (10/17 0536) BP: (130-162)/(52-63) 159/63 mmHg (10/17 0536) SpO2:  [85 %-99 %] 96 % (10/17 0536)  Intake/Output from previous day: 10/16 0701 - 10/17 0700 In: 200 [P.O.:200] Out: -  Intake/Output this shift: Total I/O In: 20 [P.O.:20] Out: 450 [Urine:450]   Recent Labs  03/14/14 1050 03/15/14 0536 03/16/14 0435  HGB 10.1* 8.3* 7.0*    Recent Labs  03/15/14 0536 03/16/14 0435  WBC 10.8* 10.2  RBC 3.05* 2.48*  HCT 26.2* 21.5*  PLT 177 155    Recent Labs  03/15/14 0536 03/16/14 0435  NA 140 139  K 4.2 4.5  CL 100 100  CO2 28 29  BUN 27* 33*  CREATININE 0.75 0.88  GLUCOSE 127* 119*  CALCIUM 8.9 8.9   No results found for this basename: LABPT, INR,  in the last 72 hours  Incision: no drainage thigh soft wounds excellent  Calf negative  Assessment/Plan: 2 Days Post-Op Procedure(s) (LRB): INTRAMEDULLARY (IM) NAIL FEMORAL (Left) Up with therapy Stable on rounds.  Post op anemia noted  hgb down to 7.0 is not uncommon after this type of fracture. Would transfuse and continue to follow. Nothing adverse at this time. Willrecheck in am. Rockne Dearinger ANDREW 03/16/2014, 9:01 AM

## 2014-03-16 NOTE — Progress Notes (Signed)
As per outgoing RN's report, pt had no urine output since she pulled her foley out in am; scanned at 2230 with 251 cc in the bladder; paged and notified Dr. Carolyne Littlesurtis Woods and he called back, gave orders for foley to be placed in and bilateral soft mittens to prevent lines from being pulled. Will continue to monitor.

## 2014-03-16 NOTE — Progress Notes (Signed)
Attempted x3 foley catheter placement by two NTs and one RN but were all unsuccessful. Will continue to monitor. Hopefully will find clinical staff in the morning to insert one and by that time her urethra might have subsided from swelling.

## 2014-03-16 NOTE — Plan of Care (Signed)
RN paged: pt HR has been as low as 48 and as high as 120s but none sustained and no heart block seen/ EKG with NSR and PACs. Not on AVN blocking agents. Pt admitted for fall and hip fx- suspect had syncope due to bradycardia prior to admit. As precaution will ck TNI and ECHO. Pt advanced age with dementia so doubt would pursue aggressive evaluation  Desiree Myers ANP

## 2014-03-16 NOTE — Progress Notes (Signed)
PT Cancellation Note  Patient Details Name: Desiree Myers MRN: [id number] DOB: [birth year]   Cancelled Treatment:     PT deferred this date, pt with Hgb of 7.0, for CT scan in am followed by transfusion.  Will follow.   Areatha Kalata 03/16/2014, 12:36 PM

## 2014-03-16 NOTE — Progress Notes (Signed)
Raised bruised area distal to left eye is 2.5x5x1.1 cm, dark red in color. Shayna Eblen, Bed Bath [employer] Beyondaylor

## 2014-03-16 NOTE — Progress Notes (Addendum)
Patient Demographics  Desiree Myers, is a 80 y.o. female, DOB - 11/27/1920, NWG:[id number]  Admit date - 03/14/2014   Admitting Physician Alison MurrayAlma M Devine, MD  Outpatient Primary MD for the patient is Ron ParkerBOWEN,SAMUEL, MD  LOS - 2   Chief Complaint  Patient presents with  . Fall  . Hip Pain    Left side        Subjective:   Desiree Myers today has, No headache, No chest pain, No abdominal pain - No Nausea, No new weakness tingling or numbness, No Cough - SOB.   Assessment & Plan    Principal Problem:   Closed left hip fracture Active Problems:   HTN (hypertension)   Leukocytosis   Alzheimer's dementia   Closed left hip fracture  - status post intramedullary nail fixation 10/15 -continue with when necessary pain medicine, -await PT/OT recommendation  HTN (hypertension)  -continue lisinopril  -monitor closely and readjust the regimen as indicated   Leukocytosis  -appears to be secondary to demargination/stress reaction secondary to hip fracture  -no clear infectious etiology, UA and CXR unremarkable for an infectious etiology  -No indication for antibiotics -resolved Alzheimer's dementia  -will go back to SNF once medically stable Anemia of chronic disease  -mild drop after surgery, will start on iron supplement. -CT head showing mild hematoma at surgical site, will hold chemical anticoagulation. -Patient will be transfused one unit packed red blood cells today.  Code Status:Full  Family Communication: No family at bedside, to try to contact the son , left him a voicemail 10/17  Disposition Plan: SNF   Procedures  INTRAMEDULLARY (IM) NAIL FEMORAL (Left) 10/15   Consults   Orhtopedics.   Medications  Scheduled Meds: . sodium chloride   Intravenous Once  . amLODipine  5 mg Oral Daily  . divalproex  125 mg Oral BID  . docusate sodium   100 mg Oral BID  . feeding supplement (ENSURE COMPLETE)  237 mL Oral BID BM  . ferrous sulfate  325 mg Oral BID WC  . lanolin/mineral oil  1 application Topical QHS  . latanoprost  1 drop Both Eyes QHS  . lisinopril  10 mg Oral Daily  . senna  1 tablet Oral QHS   Continuous Infusions: . sodium chloride 50 mL/hr at 03/15/14 1800   PRN Meds:.acetaminophen, acetaminophen, albuterol, bisacodyl, hydrALAZINE, HYDROcodone-acetaminophen, menthol-cetylpyridinium, metoCLOPramide (REGLAN) injection, metoCLOPramide, morphine injection, neomycin-bacitracin-polymyxin, ondansetron (ZOFRAN) IV, ondansetron, phenol, senna-docusate  DVT Prophylaxis SCDs   Lab Results  Component Value Date   PLT 155 03/16/2014    Antibiotics    Anti-infectives   Start     Dose/Rate Route Frequency Ordered Stop   03/15/14 0000  ceFAZolin (ANCEF) IVPB 2 g/50 mL premix     2 g 100 mL/hr over 30 Minutes Intravenous Every 6 hours 03/14/14 2121 03/15/14 1259   03/14/14 1938  polymyxin B 500,000 Units, bacitracin 50,000 Units in sodium chloride irrigation 0.9 % 500 mL irrigation  Status:  Discontinued       As needed 03/14/14 1938 03/14/14 1954   03/14/14 1300  ceFAZolin (ANCEF) IVPB 2 g/50 mL premix  Status:  Discontinued     2 g 100 mL/hr over 30 Minutes Intravenous To Surgery 03/14/14 1253 03/14/14 1710  Objective:   Filed Vitals:   03/16/14 0536 03/16/14 1326 03/16/14 1352 03/16/14 1417  BP: 159/63 148/71 158/62 162/68  Pulse: 45 103 48 49  Temp: 97.9 F (36.6 C) 99.4 F (37.4 C) 99.1 F (37.3 C) 99.3 F (37.4 C)  TempSrc: Oral Oral Oral Oral  Resp: 15 16 16 16   SpO2: 96%  100% 100%    Wt Readings from Last 3 Encounters:  No data found for Wt     Intake/Output Summary (Last 24 hours) at 03/16/14 1456 Last data filed at 03/16/14 1402  Gross per 24 hour  Intake     40 ml  Output    450 ml  Net   -410 ml     Physical Exam  Awake Alert, No new F.N deficits, Normal  affect [place].AT,PERRAL Supple Neck,No JVD, No cervical lymphadenopathy appriciated.  Symmetrical Chest wall movement, Good air movement bilaterally, CTAB RRR,No Gallops,Rubs or new Murmurs, No Parasternal Heave +ve B.Sounds, Abd Soft, No tenderness, No organomegaly appriciated, No rebound - guarding or rigidity. No Cyanosis, Clubbing or edema, No new Rash or bruise     Data Review   Micro Results Recent Results (from the past 240 hour(s))  URINE CULTURE     Status: None   Collection Time    03/14/14 10:40 AM      Result Value Ref Range Status   Specimen Description URINE, CATHETERIZED   Final   Special Requests NONE   Final   Culture  Setup Time     Final   Value: 03/14/2014 14:16     Performed at [facility]   [place] Count     Final   Value: NO GROWTH     Performed at [facility]   Culture     Final   Value: NO GROWTH     Performed at [facility]   Report Status 03/15/2014 FINAL   Final  SURGICAL PCR SCREEN     Status: None   Collection Time    03/14/14  4:05 PM      Result Value Ref Range Status   MRSA, PCR NEGATIVE  NEGATIVE Final   Staphylococcus aureus NEGATIVE  NEGATIVE Final   Comment:            The Xpert SA Assay (FDA     approved for NASAL specimens     in patients over 78 years of age),     is one component of     a comprehensive surveillance     program.  Test performance has     been validated by [facility] for patients greater     than or equal to 78 year old.     It is not intended     to diagnose infection nor to     guide or monitor treatment.    Radiology Reports Dg Hip Operative Left  03/15/2014   CLINICAL DATA:  Open reduction internal fixation for fracture  EXAM: OPERATIVE LEFT HIP  COMPARISON:  March 14, 2014  FINDINGS: Frontal and lateral views were obtained. There is screw and rod fixation through the intertrochanteric femur fracture on the left. Overall alignment is near anatomic. The tips of the screws are  in the proximal femoral head. No dislocation.  IMPRESSION: Alignment at fracture site near anatomic.  No dislocation.   Electronically Signed   By: Bretta BangWilliam  Woodruff M.D.   On: 03/15/2014 08:47   Dg Pelvis Portable  03/14/2014  CLINICAL DATA:  Postoperative evaluation of LEFT hip.  EXAM: PORTABLE PELVIS 1-2 VIEWS  COMPARISON:  LEFT hip radiograph March 14, 2014 at 10:08 a.m.  FINDINGS: Intra medullary rod and 2 pins through the femoral neck transfix comminuted LEFT femur intertrochanteric fracture. Fragments appear in alignment on this single frontal radiograph. No dislocation. No destructive bony lesions  Phleboliths and moderate amount of stool project in the pelvis. LEFT hip subcutaneous gas with overlying skin staples consistent with recent surgery.  IMPRESSION: Status post interval LEFT femur ORIF for intertrochanteric fracture with improved alignment on this single frontal radiograph.   Electronically Signed   By: Awilda Metro   On: 03/14/2014 20:56   Ct Hip Left Wo Contrast  03/16/2014   CLINICAL DATA:  Left hip arthroplasty 03/14/2014 with continued left hip pain and recent drop in hemoglobin.  EXAM: CT OF THE LEFT HIP WITHOUT CONTRAST  TECHNIQUE: Multidetector CT imaging of the left hip was performed according to the standard protocol. Multiplanar CT image reconstructions were also generated.  COMPARISON:  Plain films 03/14/2014  FINDINGS: Findings demonstrate evidence of patient's recent fixation of a trochanteric fracture of the left hip with intramedullary nail intact with associated screw bridging the fracture into the femoral head also intact and in adequate position. There is minimal degenerative change over the left sacroiliac joint and symphysis pubis joint. There is subcutaneous edema as well as air in the soft tissues over the left hip compatible with patient's recent surgery. Skin staples are present over the lateral soft tissues of the left hip. There is a small hematoma within  the lateral soft tissues of the left by measuring 2.8 x 4.5 cm in AP and transverse dimension. This hematoma is located at the level of the distal aspect of the intramedullary nail.  There is moderate fecal retention over the rectum. Foley catheter is present within a decompressed bladder.  IMPRESSION: Evidence of patient's recent internal fixation of a left trochanteric hip fracture with hardware intact. Expected air and edema within the adjacent soft tissues of the left hip. There is a small hematoma over the lateral subcutaneous fat at the level of the distal aspect of the intramedullary nail measuring 2.8 x 4.5 cm.   Electronically Signed   By: Elberta Fortis M.D.   On: 03/16/2014 09:15    CBC  Recent Labs Lab 03/14/14 1050 03/15/14 0536 03/16/14 0435  WBC 13.3* 10.8* 10.2  HGB 10.1* 8.3* 7.0*  HCT 31.9* 26.2* 21.5*  PLT 205 177 155  MCV 87.2 85.9 86.7  MCH 27.6 27.2 28.2  MCHC 31.7 31.7 32.6  RDW 13.9 13.8 14.0  LYMPHSABS 0.8  --   --   MONOABS 0.5  --   --   EOSABS 0.0  --   --   BASOSABS 0.0  --   --     Chemistries   Recent Labs Lab 03/14/14 1050 03/15/14 0536 03/16/14 0435  NA 138 140 139  K 3.9 4.2 4.5  CL 99 100 100  CO2 27 28 29   GLUCOSE 145* 127* 119*  BUN 30* 27* 33*  CREATININE 0.85 0.75 0.88  CALCIUM 9.5 8.9 8.9   ------------------------------------------------------------------------------------------------------------------ CrCl is unknown because there is no height on file for the current visit. ------------------------------------------------------------------------------------------------------------------ No results found for this basename: HGBA1C,  in the last 72 hours ------------------------------------------------------------------------------------------------------------------ No results found for this basename: CHOL, HDL, LDLCALC, TRIG, CHOLHDL, LDLDIRECT,  in the last 72  hours ------------------------------------------------------------------------------------------------------------------ No results found for this basename: TSH,  T4TOTAL, FREET3, T3FREE, THYROIDAB,  in the last 72 hours ------------------------------------------------------------------------------------------------------------------ No results found for this basename: VITAMINB12, FOLATE, FERRITIN, TIBC, IRON, RETICCTPCT,  in the last 72 hours  Coagulation profile No results found for this basename: INR, PROTIME,  in the last 168 hours  No results found for this basename: DDIMER,  in the last 72 hours  Cardiac Enzymes  Recent Labs Lab 03/14/14 1050  TROPONINI <0.30   ------------------------------------------------------------------------------------------------------------------ No components found with this basename: POCBNP,      Time Spent in minutes   25 minutes    Arkie Tagliaferro M.D on 03/16/2014 at 2:56 PM  Between 7am to 7pm - Pager - [phone]  After 7pm go to [email or web address] - password [email or web address]  And look for the night coverage person covering for me after hours  [facility] Office  [phone]   **Disclaimer: This note may have been dictated with voice recognition software. Similar sounding words can inadvertently be transcribed and this note may contain transcription errors which may not have been corrected upon publication of note.**

## 2014-03-17 DIAGNOSIS — I059 Rheumatic mitral valve disease, unspecified: Secondary | ICD-10-CM

## 2014-03-17 LAB — BASIC METABOLIC PANEL
Anion gap: 7 (ref 5–15)
BUN: 28 mg/dL — AB (ref 6–23)
CO2: 30 meq/L (ref 19–32)
Calcium: 8.8 mg/dL (ref 8.4–10.5)
Chloride: 100 mEq/L (ref 96–112)
Creatinine, Ser: 0.74 mg/dL (ref 0.50–1.10)
GFR calc Af Amer: 84 mL/min — ABNORMAL LOW (ref >90)
GFR calc non Af Amer: 73 mL/min — ABNORMAL LOW (ref >90)
Glucose, Bld: 103 mg/dL — ABNORMAL HIGH (ref 70–99)
Potassium: 4.3 mEq/L (ref 3.7–5.3)
Sodium: 137 mEq/L (ref 137–147)

## 2014-03-17 LAB — CBC
HEMATOCRIT: 25.2 % — AB (ref 36.0–46.0)
Hemoglobin: 8.3 g/dL — ABNORMAL LOW (ref 12.0–15.0)
MCH: 28.5 pg (ref 26.0–34.0)
MCHC: 32.9 g/dL (ref 30.0–36.0)
MCV: 86.6 fL (ref 78.0–100.0)
Platelets: 155 10*3/uL (ref 150–400)
RBC: 2.91 MIL/uL — ABNORMAL LOW (ref 3.87–5.11)
RDW: 13.9 % (ref 11.5–15.5)
WBC: 11.2 10*3/uL — AB (ref 4.0–10.5)

## 2014-03-17 LAB — TROPONIN I: Troponin I: <0.3 ng/mL (ref <0.30)

## 2014-03-17 NOTE — Progress Notes (Signed)
Raised bruised area lateral to left eye remains same in color, now 2x1.5x.5 cm in size. Undine Nealis, [employer]

## 2014-03-17 NOTE — Progress Notes (Signed)
Subjective: 3 Days Post-Op Procedure(s) (LRB): INTRAMEDULLARY (IM) NAIL FEMORAL (Left) Patient reports pain as 2 on 0-10 scale. HBg 8.0.Able to give me her name and appears to be improved compared to yesterday.Afebrile.   Objective: Vital signs in last 24 hours: Temp:  [98 F (36.7 C)-99.6 F (37.6 C)] 98.4 F (36.9 C) (10/18 0523) Pulse Rate:  [48-103] 84 (10/18 0523) Resp:  [16-20] 18 (10/18 0523) BP: (129-162)/(62-71) 129/62 mmHg (10/18 0523) SpO2:  [98 %-100 %] 100 % (10/18 0523)  Intake/Output from previous day: 10/17 0701 - 10/18 0700 In: 410.2 [P.O.:80; I.V.:40; Blood:290.2] Out: 700 [Urine:700] Intake/Output this shift:     Recent Labs  03/14/14 1050 03/15/14 0536 03/16/14 0435 03/17/14 0510  HGB 10.1* 8.3* 7.0* 8.3*    Recent Labs  03/16/14 0435 03/17/14 0510  WBC 10.2 11.2*  RBC 2.48* 2.91*  HCT 21.5* 25.2*  PLT 155 155    Recent Labs  03/16/14 0435 03/17/14 0510  NA 139 137  K 4.5 4.3  CL 100 100  CO2 29 30  BUN 33* 28*  CREATININE 0.88 0.74  GLUCOSE 119* 103*  CALCIUM 8.9 8.8   No results found for this basename: LABPT, INR,  in the last 72 hours  Compartment soft  Assessment/Plan: 3 Days Post-Op Procedure(s) (LRB): INTRAMEDULLARY (IM) NAIL FEMORAL (Left) Up with therapy  Ishmeal Rorie A 03/17/2014, 8:39 AM

## 2014-03-17 NOTE — Progress Notes (Signed)
Dr. Randol KernElgergawy said patient should stay in bed today d/t hematoma to hip. PT aware.

## 2014-03-17 NOTE — Progress Notes (Signed)
Patient Demographics  Desiree Myers, is a 80 y.o. female, DOB - [birth year], [id number]  Admit date - 03/14/2014   Admitting Physician Alison MurrayAlma M Devine, MD  Outpatient Primary MD for the patient is Ron ParkerBOWEN,SAMUEL, MD  LOS - 3   Chief Complaint  Patient presents with  . Fall  . Hip Pain    Left side        Subjective:   Desiree Myers today has, No headache, No chest pain, No abdominal pain - No Nausea, No new weakness tingling or numbness, No Cough - SOB.   Assessment & Plan    Principal Problem:   Closed left hip fracture Active Problems:   HTN (hypertension)   Leukocytosis   Alzheimer's dementia   Closed left hip fracture  - status post intramedullary nail fixation 10/15 -continue with when necessary pain medicine, -PT/OT   HTN (hypertension)  -continue lisinopril  -monitor closely and readjust the regimen as indicated   Leukocytosis  -appears to be secondary to demargination/stress reaction secondary to hip fracture  -no clear infectious etiology, UA and CXR unremarkable for an infectious etiology  -No indication for antibiotics -resolved Alzheimer's dementia  -will go back to SNF once medically stable  Anemia of chronic disease  -mild drop after surgery, will start on iron supplement. -CT head showing mild hematoma at surgical site, will hold chemical anticoagulation. -Patient will be transfused one unit packed red blood cells 10/17  Code Status:Full  Family Communication: No family at bedside, discussed with son 10/17  Disposition Plan: SNF   Procedures  INTRAMEDULLARY (IM) NAIL FEMORAL (Left) 10/15 One unit packed red blood cell transfusion 10/17  Consults   Orhtopedics.   Medications  Scheduled Meds: . sodium chloride   Intravenous Once  . amLODipine  5 mg Oral Daily  . divalproex  125 mg Oral BID  . docusate sodium   100 mg Oral BID  . feeding supplement (ENSURE COMPLETE)  237 mL Oral BID BM  . ferrous sulfate  325 mg Oral BID WC  . lanolin/mineral oil  1 application Topical QHS  . latanoprost  1 drop Both Eyes QHS  . lisinopril  10 mg Oral Daily  . senna  1 tablet Oral QHS   Continuous Infusions: . sodium chloride 50 mL/hr at 03/16/14 2300   PRN Meds:.acetaminophen, acetaminophen, albuterol, bisacodyl, hydrALAZINE, HYDROcodone-acetaminophen, menthol-cetylpyridinium, metoCLOPramide (REGLAN) injection, metoCLOPramide, morphine injection, neomycin-bacitracin-polymyxin, ondansetron (ZOFRAN) IV, ondansetron, phenol, senna-docusate  DVT Prophylaxis SCDs   Lab Results  Component Value Date   PLT 155 03/17/2014    Antibiotics    Anti-infectives   Start     Dose/Rate Route Frequency Ordered Stop   03/15/14 0000  ceFAZolin (ANCEF) IVPB 2 g/50 mL premix     2 g 100 mL/hr over 30 Minutes Intravenous Every 6 hours 03/14/14 2121 03/15/14 1259   03/14/14 1938  polymyxin B 500,000 Units, bacitracin 50,000 Units in sodium chloride irrigation 0.9 % 500 mL irrigation  Status:  Discontinued       As needed 03/14/14 1938 03/14/14 1954   03/14/14 1300  ceFAZolin (ANCEF) IVPB 2 g/50 mL premix  Status:  Discontinued     2 g 100 mL/hr over 30 Minutes Intravenous To Surgery 03/14/14 1253 03/14/14 1710  Objective:   Filed Vitals:   03/17/14 0400 03/17/14 0523 03/17/14 0800 03/17/14 1128  BP:  129/62    Pulse:  84    Temp:  98.4 F (36.9 C)    TempSrc:  Oral    Resp: 18 18 18 18   SpO2: 98% 100%      Wt Readings from Last 3 Encounters:  No data found for Wt     Intake/Output Summary (Last 24 hours) at 03/17/14 1410 Last data filed at 03/17/14 1039  Gross per 24 hour  Intake 450.17 ml  Output    575 ml  Net -124.83 ml     Physical Exam  Awake Alert, No new F.N deficits, Normal affect [place].AT,PERRAL Supple Neck,No JVD, No cervical lymphadenopathy appriciated.  Symmetrical Chest wall  movement, Good air movement bilaterally, CTAB RRR,No Gallops,Rubs or new Murmurs, No Parasternal Heave +ve B.Sounds, Abd Soft, No tenderness, No organomegaly appriciated, No rebound - guarding or rigidity. No Cyanosis, Clubbing or edema, No new Rash or bruise     Data Review   Micro Results Recent Results (from the past 240 hour(s))  URINE CULTURE     Status: None   Collection Time    03/14/14 10:40 AM      Result Value Ref Range Status   Specimen Description URINE, CATHETERIZED   Final   Special Requests NONE   Final   Culture  Setup Time     Final   Value: 03/14/2014 14:16     Performed at [facility] Count     Final   Value: NO GROWTH     Performed at [facility]   Culture     Final   Value: NO GROWTH     Performed at [facility]   Report Status 03/15/2014 FINAL   Final  SURGICAL PCR SCREEN     Status: None   Collection Time    03/14/14  4:05 PM      Result Value Ref Range Status   MRSA, PCR NEGATIVE  NEGATIVE Final   Staphylococcus aureus NEGATIVE  NEGATIVE Final   Comment:            The Xpert SA Assay (FDA     approved for NASAL specimens     in patients over 23 years of age),     is one component of     a comprehensive surveillance     program.  Test performance has     been validated by [facility] for patients greater     than or equal to 26 year old.     It is not intended     to diagnose infection nor to     guide or monitor treatment.    Radiology Reports Ct Hip Left Wo Contrast  03/16/2014   CLINICAL DATA:  Left hip arthroplasty 03/14/2014 with continued left hip pain and recent drop in hemoglobin.  EXAM: CT OF THE LEFT HIP WITHOUT CONTRAST  TECHNIQUE: Multidetector CT imaging of the left hip was performed according to the standard protocol. Multiplanar CT image reconstructions were also generated.  COMPARISON:  Plain films 03/14/2014  FINDINGS: Findings demonstrate evidence of patient's recent fixation of a  trochanteric fracture of the left hip with intramedullary nail intact with associated screw bridging the fracture into the femoral head also intact and in adequate position. There is minimal degenerative change over the left sacroiliac joint and symphysis pubis joint. There is  subcutaneous edema as well as air in the soft tissues over the left hip compatible with patient's recent surgery. Skin staples are present over the lateral soft tissues of the left hip. There is a small hematoma within the lateral soft tissues of the left by measuring 2.8 x 4.5 cm in AP and transverse dimension. This hematoma is located at the level of the distal aspect of the intramedullary nail.  There is moderate fecal retention over the rectum. Foley catheter is present within a decompressed bladder.  IMPRESSION: Evidence of patient's recent internal fixation of a left trochanteric hip fracture with hardware intact. Expected air and edema within the adjacent soft tissues of the left hip. There is a small hematoma over the lateral subcutaneous fat at the level of the distal aspect of the intramedullary nail measuring 2.8 x 4.5 cm.   Electronically Signed   By: Elberta Fortisaniel  Boyle M.D.   On: 03/16/2014 09:15    CBC  Recent Labs Lab 03/14/14 1050 03/15/14 0536 03/16/14 0435 03/17/14 0510  WBC 13.3* 10.8* 10.2 11.2*  HGB 10.1* 8.3* 7.0* 8.3*  HCT 31.9* 26.2* 21.5* 25.2*  PLT 205 177 155 155  MCV 87.2 85.9 86.7 86.6  MCH 27.6 27.2 28.2 28.5  MCHC 31.7 31.7 32.6 32.9  RDW 13.9 13.8 14.0 13.9  LYMPHSABS 0.8  --   --   --   MONOABS 0.5  --   --   --   EOSABS 0.0  --   --   --   BASOSABS 0.0  --   --   --     Chemistries   Recent Labs Lab 03/14/14 1050 03/15/14 0536 03/16/14 0435 03/17/14 0510  NA 138 140 139 137  K 3.9 4.2 4.5 4.3  CL 99 100 100 100  CO2 27 28 29 30   GLUCOSE 145* 127* 119* 103*  BUN 30* 27* 33* 28*  CREATININE 0.85 0.75 0.88 0.74  CALCIUM 9.5 8.9 8.9 8.8    ------------------------------------------------------------------------------------------------------------------ CrCl is unknown because there is no height on file for the current visit. ------------------------------------------------------------------------------------------------------------------ No results found for this basename: HGBA1C,  in the last 72 hours ------------------------------------------------------------------------------------------------------------------ No results found for this basename: CHOL, HDL, LDLCALC, TRIG, CHOLHDL, LDLDIRECT,  in the last 72 hours ------------------------------------------------------------------------------------------------------------------ No results found for this basename: TSH, T4TOTAL, FREET3, T3FREE, THYROIDAB,  in the last 72 hours ------------------------------------------------------------------------------------------------------------------ No results found for this basename: VITAMINB12, FOLATE, FERRITIN, TIBC, IRON, RETICCTPCT,  in the last 72 hours  Coagulation profile No results found for this basename: INR, PROTIME,  in the last 168 hours  No results found for this basename: DDIMER,  in the last 72 hours  Cardiac Enzymes  Recent Labs Lab 03/16/14 2308 03/17/14 0510 03/17/14 1002  TROPONINI <0.30 <0.30 <0.30   ------------------------------------------------------------------------------------------------------------------ No components found with this basename: POCBNP,      Time Spent in minutes   25 minutes    Makyiah Lie M.D on 03/17/2014 at 2:10 PM  Between 7am to 7pm - Pager - [phone]  After 7pm go to [email or web address] - password [email or web address]  And look for the night coverage person covering for me after hours  [facility] Office  [phone]   **Disclaimer: This note may have been dictated with voice recognition software. Similar sounding words can inadvertently be transcribed and  this note may contain transcription errors which may not have been corrected upon publication of note.**

## 2014-03-17 NOTE — Progress Notes (Addendum)
Note patient trend of low Hr in mid 40s on previous shift and ranging to non sustained in 120s. Otherwise other vs are within her baseline. No discomfort observed. Completed 12 lead - compared to previous. no obvious deviation from previous. Made on call aware of trends. Orders given for cardiac panel and 2d echo. Will cont to monitor.

## 2014-03-17 NOTE — Discharge Instructions (Addendum)
Change dressing daily Partial weightbearing left LE Aspirin 325mg  daily for DVT prophylaxis

## 2014-03-17 NOTE — Progress Notes (Addendum)
Safety sitter discontinued per Dr. Randol KernElgergawy d/t patient no longer agitated or pulling at lines. Patient resting in bed with bed alarm on and door open.

## 2014-03-18 LAB — TYPE AND SCREEN
ABO/RH(D): O NEG
Antibody Screen: NEGATIVE
UNIT DIVISION: 0
Unit division: 0

## 2014-03-18 LAB — HEMOGLOBIN AND HEMATOCRIT, BLOOD
HCT: 22.7 % — ABNORMAL LOW (ref 36.0–46.0)
Hemoglobin: 7.4 g/dL — ABNORMAL LOW (ref 12.0–15.0)

## 2014-03-18 LAB — PREPARE RBC (CROSSMATCH)

## 2014-03-18 MED ORDER — SODIUM CHLORIDE 0.9 % IV SOLN
Freq: Once | INTRAVENOUS | Status: AC
  Administered 2014-03-18: 11:00:00 via INTRAVENOUS

## 2014-03-18 MED ORDER — LIP MEDEX EX OINT
TOPICAL_OINTMENT | CUTANEOUS | Status: AC
  Filled 2014-03-18: qty 7

## 2014-03-18 MED ORDER — HYDROCODONE-ACETAMINOPHEN 5-325 MG PO TABS
1.0000 | ORAL_TABLET | Freq: Four times a day (QID) | ORAL | Status: DC | PRN
  Administered 2014-03-18: 1 via ORAL
  Filled 2014-03-18: qty 1

## 2014-03-18 NOTE — Progress Notes (Signed)
NUTRITION FOLLOW UP   INTERVENTION: Dysphagia 2 thin liquid diet per SLP Ensure Complete po BID, each supplement provides 350 kcal and 13 grams of protein Magic cup on trays. Needs increased assistance to feed. RD to follow  NUTRITION DIAGNOSIS: Inadequate oral intake related to dementia as evidenced by staff report.   Goal: Intake of meals and supplements to meet >90% estimated needs.  Monitor:  Intake, diet tolerance, labs, weight trend  Reason for Assessment: Consult-hip fx  80 y.o. female  Admitting Dx: Closed left hip fracture  ASSESSMENT: Patient s/p hip fracture post op day 1.  Hx includes dementia. [facility] from [facility] [phone].  They reported that patient usually has a good intake, recent weight loss of 2 lbs with weight of 109 lbs.  Height unknown.  Diet was chopped meats.  10/16: Currently, patient with decreased intake on regular diet.  She has consult for a swallow evaluation secondary to pushing food forward when chewing.  She will take a bite then say that she is not hungry but if staff continues to go in to feed her she has forgotten they were just there and continue to eat.  10/19:   Spoke with son and daughter in law.  Intake is very poor.  Will drink Ensure.  Takes increased work to get nutrition in secondary to demential and poor appetite.  Seen by SLP 10/16 with mild-moderate oal phase dysphagia characteristic of dementia with decreased mastication and use of vertical chewing and holds bolus orally requiring cues to swallow.  Patient to return to SNF.  Height: Ht Readings from Last 1 Encounters:  03/18/14 5\' 4"  (1.626 m)    Weight: Wt Readings from Last 1 Encounters:  No data found for Wt    Ideal Body Weight: unknown  % Ideal Body Weight: unknown  Wt Readings from Last 10 Encounters:  No data found for Wt    Usual Body Weight: 111 lbs  % Usual Body Weight: unknown  BMI:  There is no weight on file to calculate  BMI.  Estimated Nutritional Needs: Kcal: 1300-1400 kcal Protein: 50-60 gm Fluid: >/=1.3L  Skin: incision  Diet Order: Regular  EDUCATION NEEDS: -No education needs identified at this time   Intake/Output Summary (Last 24 hours) at 03/18/14 1629 Last data filed at 03/18/14 1517  Gross per 24 hour  Intake 1741.6 ml  Output    425 ml  Net 1316.6 ml     Labs:   Recent Labs Lab 03/15/14 0536 03/16/14 0435 03/17/14 0510  NA 140 139 137  K 4.2 4.5 4.3  CL 100 100 100  CO2 28 29 30   BUN 27* 33* 28*  CREATININE 0.75 0.88 0.74  CALCIUM 8.9 8.9 8.8  GLUCOSE 127* 119* 103*    CBG (last 3)  No results found for this basename: GLUCAP,  in the last 72 hours  Scheduled Meds: . sodium chloride   Intravenous Once  . amLODipine  5 mg Oral Daily  . divalproex  125 mg Oral BID  . docusate sodium  100 mg Oral BID  . feeding supplement (ENSURE COMPLETE)  237 mL Oral BID BM  . ferrous sulfate  325 mg Oral BID WC  . lanolin/mineral oil  1 application Topical QHS  . latanoprost  1 drop Both Eyes QHS  . lip balm      . senna  1 tablet Oral QHS    Continuous Infusions: . sodium chloride 50 mL/hr at 03/18/14 1517  Past Medical History  Diagnosis Date  . Alzheimer's dementia   . Hypertension   . Glaucoma   . CVA (cerebral infarction)   . Arteriosclerotic cardiovascular disease   . Stroke     Past Surgical History  Procedure Laterality Date  . Femur im nail Left 03/14/2014    Procedure: INTRAMEDULLARY (IM) NAIL FEMORAL;  Surgeon: Javier DockerJeffrey C Beane, MD;  Location: [facility];  Service: Orthopedics;  Laterality: Left;    Oran ReinLaura Mayra Brahm, RD, LDN Clinical Inpatient Dietitian Pager:  [phone] Weekend and after hours pager:  [phone]

## 2014-03-18 NOTE — Progress Notes (Signed)
PT Cancellation Note  ___Treatment cancelled today due to medical issues with patient which prohibited therapy  _X_ Treatment cancelled today due to patient receiving blood transfusion.    ___ Treatment cancelled today due to patient's refusal to participate   ___ Treatment cancelled today due to   Felecia ShellingLori Tuff Clabo  PTA [facility]  Acute  Rehab Pager      [phone]

## 2014-03-18 NOTE — Progress Notes (Signed)
80 y/o ?, severe dementia, hypertension, admitted from skilled facility 10/15 with multiple falls. Noted to have left hip fracture .  Underwent left-sided IM nailing 10/16 Dr. Shelle IronBeane.  Postop course complicated by anemia and hematoma 2 left hip area and also hematoma to face and neck from prior fall and aspirin 325 given for subsequent DVT prophylaxis  Subjective:   Confused   able to say hello but not really able to orient otherwise Nursing reports no specific other complaints  Assessment & Plan    Principal Problem:   Closed left hip fracture Active Problems:   HTN (hypertension)   Leukocytosis   Alzheimer's dementia   Closed left hip fracture  - status post intramedullary nail fixation 10/15 -continue with by mouth Norco 1 tab every 6 when necessary, discontinue IV morphine -PT/OT recommending skilled facility  HTN (hypertension)  -continue amlodipine 5 mg daily -ACE inhibitor held because of azotemia -monitor   Leukocytosis  -appears to be secondary to demargination/stress reaction secondary to hip fracture  -no clear infectious etiology, UA and CXR unremarkable for an infectious etiology  -No indication for antibiotics -resolved  Alzheimer's dementia  -At least moderate possibly severe -will go back to SNF once medically stable  Anemia of chronic disease  -mild drop after surgery, will start on iron supplement. -CT left hip showing mild hematoma 2.8 X. 4.5 centimeters , will hold chemical anticoagulation with aspirin.  Continue SCDs -Patient will be transfused one unit packed red blood cells 10/17 -Drop in hemoglobin again 10/19 necessitated one more unit of  AKI      -Continue IV saline 50 cc per hour.      -Repeat basic metabolic panel in a.m.      -Discontinue lisinopril 10 mg daily   Code Status:Full  Family Communication: No family at  bedside, discussed with son 10/19  Disposition Plan: SNF   Procedures  INTRAMEDULLARY (IM) NAIL FEMORAL (Left) 10/15 One unit packed red blood cell transfusion 10/17 One unit of packed red blood cell 10/19  Consults   Orhtopedics.   Medications  Scheduled Meds: . sodium chloride   Intravenous Once  . amLODipine  5 mg Oral Daily  . divalproex  125 mg Oral BID  . docusate sodium  100 mg Oral BID  . feeding supplement (ENSURE COMPLETE)  237 mL Oral BID BM  . ferrous sulfate  325 mg Oral BID WC  . lanolin/mineral oil  1 application Topical QHS  . latanoprost  1 drop Both Eyes QHS  . lip balm      . lisinopril  10 mg Oral Daily  . senna  1 tablet Oral QHS   Continuous Infusions: . sodium chloride 50 mL/hr at 03/17/14 1930   PRN Meds:.acetaminophen, acetaminophen, albuterol, bisacodyl, hydrALAZINE, HYDROcodone-acetaminophen, menthol-cetylpyridinium, metoCLOPramide (REGLAN) injection, metoCLOPramide, morphine injection, neomycin-bacitracin-polymyxin, ondansetron (ZOFRAN) IV, ondansetron, phenol, senna-docusate  DVT Prophylaxis SCDs   Lab Results  Component Value Date   PLT 155 03/17/2014    Antibiotics    Anti-infectives   Start     Dose/Rate Route Frequency Ordered Stop   03/15/14 0000  ceFAZolin (ANCEF) IVPB 2 g/50 mL premix     2 g 100 mL/hr over 30 Minutes Intravenous Every 6 hours 03/14/14 2121 03/15/14 1259   03/14/14 1938  polymyxin B 500,000 Units, bacitracin 50,000 Units in sodium chloride irrigation 0.9 % 500 mL irrigation  Status:  Discontinued       As needed 03/14/14 1938 03/14/14 1954   03/14/14 1300  ceFAZolin (ANCEF) IVPB 2 g/50 mL premix  Status:  Discontinued     2 g 100 mL/hr over 30 Minutes Intravenous To Surgery 03/14/14 1253 03/14/14 1710          Objective:   Filed Vitals:   03/17/14 1600 03/17/14 2124 03/18/14 0500 03/18/14 0800  BP:  148/58 102/45   Pulse:  108 100   Temp:  99.2 F (37.3 C) 99.4 F (37.4 C)   TempSrc:  Axillary  Axillary   Resp: 18 18 16 18   Height:      SpO2: 97% 98% 94%     Wt Readings from Last 3 Encounters:  No data found for Wt     Intake/Output Summary (Last 24 hours) at 03/18/14 1113 Last data filed at 03/18/14 1009  Gross per 24 hour  Intake   1670 ml  Output    425 ml  Net   1245 ml     Physical Exam  Awake Alert, No new F.N deficits, Normal affect [place].AT,PERRAL Supple Neck,No JVD, No cervical lymphadenopathy appriciated.  Symmetrical Chest wall movement, Good air movement bilaterally, CTAB RRR,No Gallops,Rubs or new Murmurs, No Parasternal Heave   Left hip wound exam does not reveal any significant swelling  Data Review   Micro Results Recent Results (from the past 240 hour(s))  URINE CULTURE     Status: None   Collection Time    03/14/14 10:40 AM      Result Value Ref Range Status   Specimen Description URINE, CATHETERIZED   Final   Special Requests NONE   Final   Culture  Setup Time     Final   Value: 03/14/2014 14:16     Performed at [facility]   Colony Count     Final   Value: NO GROWTH     Performed at [facility]   Culture     Final   Value: NO GROWTH     Performed at [facility]   Report Status 03/15/2014 FINAL   Final  SURGICAL PCR SCREEN     Status: None   Collection Time    03/14/14  4:05 PM      Result Value Ref Range Status   MRSA, PCR NEGATIVE  NEGATIVE Final   Staphylococcus aureus NEGATIVE  NEGATIVE Final   Comment:            The Xpert SA Assay (FDA     approved for NASAL specimens     in patients over 78 years of age),     is one component of     a comprehensive surveillance     program.  Test performance has     been validated by [facility] for patients greater     than or equal to 78 year old.     It is not intended     to diagnose infection nor to     guide or monitor treatment.    Radiology Reports No results found.  CBC  Recent Labs Lab 03/14/14 1050 03/15/14 0536 03/16/14 0435  03/17/14 0510 03/18/14 0425  WBC 13.3* 10.8* 10.2 11.2*  --   HGB 10.1* 8.3* 7.0* 8.3* 7.4*  HCT 31.9* 26.2* 21.5* 25.2* 22.7*  PLT 205 177 155 155  --   MCV 87.2 85.9 86.7 86.6  --   MCH 27.6 27.2 28.2 28.5  --   MCHC 31.7 31.7 32.6 32.9  --   RDW 13.9 13.8  14.0 13.9  --   LYMPHSABS 0.8  --   --   --   --   MONOABS 0.5  --   --   --   --   EOSABS 0.0  --   --   --   --   BASOSABS 0.0  --   --   --   --     Chemistries   Recent Labs Lab 03/14/14 1050 03/15/14 0536 03/16/14 0435 03/17/14 0510  NA 138 140 139 137  K 3.9 4.2 4.5 4.3  CL 99 100 100 100  CO2 27 28 29 30   GLUCOSE 145* 127* 119* 103*  BUN 30* 27* 33* 28*  CREATININE 0.85 0.75 0.88 0.74  CALCIUM 9.5 8.9 8.9 8.8   ------------------------------------------------------------------------------------------------------------------ CrCl is unknown because both a height and weight (above a minimum accepted value) are required for this calculation. ------------------------------------------------------------------------------------------------------------------ No results found for this basename: HGBA1C,  in the last 72 hours ------------------------------------------------------------------------------------------------------------------ No results found for this basename: CHOL, HDL, LDLCALC, TRIG, CHOLHDL, LDLDIRECT,  in the last 72 hours ------------------------------------------------------------------------------------------------------------------ No results found for this basename: TSH, T4TOTAL, FREET3, T3FREE, THYROIDAB,  in the last 72 hours ------------------------------------------------------------------------------------------------------------------ No results found for this basename: VITAMINB12, FOLATE, FERRITIN, TIBC, IRON, RETICCTPCT,  in the last 72 hours  Coagulation profile No results found for this basename: INR, PROTIME,  in the last 168 hours  No results found for this basename: DDIMER,  in the  last 72 hours  Cardiac Enzymes  Recent Labs Lab 03/16/14 2308 03/17/14 0510 03/17/14 1002  TROPONINI <0.30 <0.30 <0.30   ------------------------------------------------------------------------------------------------------------------ No components found with this basename: POCBNP,    15 minutes No family at the bedside Discharge hopefully in the a.m. to skilled facility  Pleas Koch, MD [facility] [phone]

## 2014-03-18 NOTE — Progress Notes (Signed)
Subjective: 4 Days Post-Op Procedure(s) (LRB): INTRAMEDULLARY (IM) NAIL FEMORAL (Left) Patient reports pain as mild.    Objective: Vital signs in last 24 hours: Temp:  [99.2 F (37.3 C)-99.4 F (37.4 C)] 99.4 F (37.4 C) (10/19 0500) Pulse Rate:  [91-108] 100 (10/19 0500) Resp:  [16-18] 16 (10/19 0500) BP: (102-148)/(45-65) 102/45 mmHg (10/19 0500) SpO2:  [94 %-98 %] 94 % (10/19 0500)  Intake/Output from previous day: 10/18 0701 - 10/19 0700 In: 1670 [P.O.:120; I.V.:1550] Out: 600 [Urine:600] Intake/Output this shift:     Recent Labs  03/16/14 0435 03/17/14 0510 03/18/14 0425  HGB 7.0* 8.3* 7.4*    Recent Labs  03/16/14 0435 03/17/14 0510 03/18/14 0425  WBC 10.2 11.2*  --   RBC 2.48* 2.91*  --   HCT 21.5* 25.2* 22.7*  PLT 155 155  --     Recent Labs  03/16/14 0435 03/17/14 0510  NA 139 137  K 4.5 4.3  CL 100 100  CO2 29 30  BUN 33* 28*  CREATININE 0.88 0.74  GLUCOSE 119* 103*  CALCIUM 8.9 8.8   No results found for this basename: LABPT, INR,  in the last 72 hours  Neurologically intact ABD soft Neurovascular intact Sensation intact distally Intact pulses distally Dorsiflexion/Plantar flexion intact Incision: dressing C/D/I and no drainage No cellulitis present Compartment soft no calf pain or sign of DVT  Assessment/Plan: 4 Days Post-Op Procedure(s) (LRB): INTRAMEDULLARY (IM) NAIL FEMORAL (Left) Advance diet Up with therapy D/C IV fluids Hgb 7.4, will transfuse with 1 unit PRBCs this AM Continue PWB LLE D/C to SNF when medically stable  BISSELL, JACLYN M. 03/18/2014, 7:52 AM

## 2014-03-18 NOTE — Progress Notes (Signed)
CSW continuing to follow for disposition planning.   CSW received notification from weekend CSW that pt son was interested in exploring SNF options for pt in [place] area as well and weekend CSW expanded search.  CSW followed up with pt son, Fayrene FearingJames regarding SNF bed offers. Pt son [facility] bed [facility] [place], [place]. CS[facility] [phone]) and notified facility of pt family acceptance of bed offer. [facility] admission coordinator, Porfirio MylarCarmen confirmed bed availability when pt medically ready for discharge.   CSW inquired with [facility] regarding transport at pt son request and [facility] stated that pt son would not have to provide up front payment for transport as [facility] is [place] [employer] [place]. CSW notified pt son.   Pt son appreciative of assistance and grateful that pt will be closer to his home.   CSW to continue to follow and facilitate pt discharge need to [facility] when pt medically ready for discharge.   Loletta SpecterSuzanna Kidd, MSW, LCSW Clinical Social Work Coverage for [employer] Ha[employer]er, [place] [phone]

## 2014-03-18 NOTE — Progress Notes (Signed)
Speech Language Pathology Treatment: Dysphagia  Patient Details Name: Desiree BorgMary Lalli MRN: [id number] DOB: [birth year] Today's Date: 03/18/2014 Time: [id number] SLP Time Calculation (min): 14 min  Assessment / Plan / Recommendation Clinical Impression  Follow-up assessment of diet tolerance, as pt has not been d/c'd to SNF.  Pt sleeping, but awoke easily to voice/touch.  Pt was noted to have white residue on tongue (likely crushed meds).  This cleared with sips of water.  No overt s/s of aspiration with sips of water via straw.  Pt appears to be tolerating Dysphagia 2 with thin liquids, however, oral care after meals and meds is needed to decrease pocketing and aspiration risks.  D/C ST at this time, with f/u ST at SNF.   HPI HPI: 80 y.o. Female, resident of SNF, [facility] after an episode of fall at the facility. Please note that pt is unable to provide much history due to dementia. Per records, pt apparently complained of left hip pain 3 days prior to this admission and was noted to have contusion on the left lateral forehead. It was assumed she fell. She continued to complain of left hip pain and was brought in for further evaluation. There was no reported chest pain, shortness of breath, no reported abd or urinary concerns, no reported fevers, chills.   Pertinent Vitals Pain Assessment: No/denies pain  SLP Plan  Continue with current plan of care    Recommendations Diet recommendations: Dysphagia 2 (fine chop);Thin liquid Liquids provided via: Cup;Straw Medication Administration: Whole meds with puree Supervision: Staff to assist with self feeding;Full supervision/cueing for compensatory strategies Compensations: Slow rate;Small sips/bites;Check for pocketing Postural Changes and/or Swallow Maneuvers: Seated upright 90 degrees              Oral Care Recommendations: Oral care before and after PO;Staff/trained caregiver to provide oral care Follow up Recommendations: Skilled  Nursing facility Plan: Continue with current plan of care    GO     Maryjo RochesterWillis, Rykker Coviello T 03/18/2014, 4:49 PM

## 2014-03-19 LAB — CBC
HCT: 26 % — ABNORMAL LOW (ref 36.0–46.0)
Hemoglobin: 8.6 g/dL — ABNORMAL LOW (ref 12.0–15.0)
MCH: 28.9 pg (ref 26.0–34.0)
MCHC: 33.1 g/dL (ref 30.0–36.0)
MCV: 87.2 fL (ref 78.0–100.0)
PLATELETS: 197 10*3/uL (ref 150–400)
RBC: 2.98 MIL/uL — AB (ref 3.87–5.11)
RDW: 14.5 % (ref 11.5–15.5)
WBC: 10 10*3/uL (ref 4.0–10.5)

## 2014-03-19 LAB — TYPE AND SCREEN
ABO/RH(D): O NEG
Antibody Screen: NEGATIVE
Unit division: 0

## 2014-03-19 LAB — BASIC METABOLIC PANEL
Anion gap: 9 (ref 5–15)
BUN: 32 mg/dL — ABNORMAL HIGH (ref 6–23)
CALCIUM: 8.8 mg/dL (ref 8.4–10.5)
CO2: 30 mEq/L (ref 19–32)
CREATININE: 0.71 mg/dL (ref 0.50–1.10)
Chloride: 104 mEq/L (ref 96–112)
GFR calc Af Amer: 85 mL/min — ABNORMAL LOW (ref >90)
GFR, EST NON AFRICAN AMERICAN: 74 mL/min — AB (ref >90)
GLUCOSE: 85 mg/dL (ref 70–99)
POTASSIUM: 3.8 meq/L (ref 3.7–5.3)
Sodium: 143 mEq/L (ref 137–147)

## 2014-03-19 MED ORDER — FERROUS SULFATE 325 (65 FE) MG PO TABS: 325.0000 mg | ORAL_TABLET | Freq: Two times a day (BID) | ORAL | Status: AC

## 2014-03-19 MED ORDER — MAGIC MOUTHWASH: 5.0000 mL | Freq: Three times a day (TID) | ORAL | Status: AC

## 2014-03-19 NOTE — Progress Notes (Signed)
RN called report to nurse at [facility]. All questions answered.   Patient transported via [facility] to SNF.

## 2014-03-19 NOTE — Progress Notes (Signed)
Clinical Social Work  CSW faxed DC summary to [facility] and spoke with Porfirio MylarCarmen who reports they can accept patient after 3pm today. CSW prepared DC packet with DC summary, FL2, and hard scripts included. CSW informed patient and son via phone of DC plans. Son prefers [facility] to transport patient and is aware of no guarantee of payment. [facility] request #: [id number].  CSW is signing off but available if needed.  Unk LightningHolly Prudence Heiny, LCSW (Coverage for [employer] Haidinger)

## 2014-03-19 NOTE — Discharge Summary (Signed)
Physician Discharge Summary  Desiree Myers ZOX:[id number] DOB: 1920-08-02 DOA: 03/14/2014  PCP: Ron Parker, MD  Admit date: 03/14/2014 Discharge date: 03/19/2014  Time spent: 20 minutes  Recommendations for Outpatient Follow-up:  1. Needed CBC was differential in about one week along with chem 7 2. Continue aspirin 325 daily for DVT prophylaxis-note that not a good candidate for full anticoagulation as anemic this admission 3. Partial weight bearing to affected hip 4. If there is failure to improve with therapy and does not move around, consider goals of care and hospice   Discharge Diagnoses:  Principal Problem:   Closed left hip fracture Active Problems:   HTN (hypertension)   Leukocytosis   Alzheimer's dementia   Discharge Condition: Guarded  Diet recommendation: Liberalize diet to regular on discharge  There were no vitals filed for this visit.  History of present illness:  80 y/o ?, severe dementia, hypertension, admitted from skilled facility 10/15 with multiple falls. Noted to have left hip fracture .  Underwent left-sided IM nailing 10/16 Dr. Shelle Iron. Postop course complicated by anemia and hematoma 2 left hip area and also hematoma to face and neck from prior fall and aspirin 325 given for subsequent DVT prophylaxis   Hospital Course:  Closed left hip fracture  - status post intramedullary nail fixation 10/15  -continue with by mouth Norco 1 tab every 6 when necessary, discontinue IV morphine  -PT/OT recommending skilled facility   HTN (hypertension)  -continue amlodipine 5 mg daily  -ACE inhibitor held because of azotemia  -monitor  Leukocytosis  -appears to be secondary to demargination/stress reaction secondary to hip fracture  -no clear infectious etiology, UA and CXR unremarkable for an infectious etiology  -No indication for antibiotics  -resolved  Alzheimer's dementia  -At least moderate possibly severe  -will go back to SNF once medically stable   Anemia of chronic disease  -mild drop after surgery, will start on iron supplement. Also was on IV fluids so could be dilutional component of anemia -CT left hip showing mild hematoma 2.8 X. 4.5 centimeters , will hold chemical anticoagulation with aspirin. Continue SCDs  -Patient will be transfused one unit packed red blood cells 10/17  -Drop in hemoglobin again 10/19 necessitated one more unit of blood transfusion. -Will need repeat blood count checked. Transfuse if hemoglobin below 7 AKI  -Was on IV saline 50 cc per hour -Repeat basic metabolic panel in a.m.  -Discontinue lisinopril 10 mg daily  ? Thrush -Mouth exam shows some dryness and this has been cleaned off and it did not clear up - start Magic mouthwash on discharge   Discharge Exam: Filed Vitals:   03/19/14 0743  BP:   Pulse:   Temp:   Resp: 20   Alert present but not oriented General: No pain, no tolerating diet Cardiovascular: S1-S2 no murmur rub or gallop Respiratory: Clinically clear  Discharge Instructions You were cared for by a hospitalist during your hospital stay. If you have any questions about your discharge medications or the care you received while you were in the hospital after you are discharged, you can call the unit and asked to speak with the hospitalist on call if the hospitalist that took care of you is not available. Once you are discharged, your primary care physician will handle any further medical issues. Please note that NO REFILLS for any discharge medications will be authorized once you are discharged, as it is imperative that you return to your primary care physician (or establish a relationship with  a primary care physician if you do not have one) for your aftercare needs so that they can reassess your need for medications and monitor your lab values.  Discharge Instructions   Diet - low sodium heart healthy    Complete by:  As directed      Increase activity slowly    Complete by:  As  directed      Partial weight bearing    Complete by:  As directed   % Body Weight:  50  Laterality:  left          Current Discharge Medication List    START taking these medications   Details  aspirin EC 325 MG tablet Take 1 tablet (325 mg total) by mouth daily. Qty: 20 tablet, Refills: 0    ferrous sulfate 325 (65 FE) MG tablet Take 1 tablet (325 mg total) by mouth 2 (two) times daily with a meal. Qty: 60 tablet, Refills: 2    HYDROcodone-acetaminophen (NORCO/VICODIN) 5-325 MG per tablet Take 1 tablet by mouth every 4 (four) hours as needed. Qty: 60 tablet, Refills: 0      CONTINUE these medications which have NOT CHANGED   Details  !! acetaminophen (TYLENOL) 500 MG tablet Take 500 mg by mouth every 4 (four) hours as needed for mild pain, fever or headache.     !! acetaminophen (TYLENOL) 500 MG tablet Take 500 mg by mouth every 6 (six) hours.    albuterol (PROVENTIL HFA;VENTOLIN HFA) 108 (90 BASE) MCG/ACT inhaler Inhale 2 puffs into the lungs every 4 (four) hours as needed for shortness of breath.     alum & mag hydroxide-simeth (MAALOX PLUS) 400-400-40 MG/5ML suspension Take 30 mLs by mouth 4 (four) times daily as needed for indigestion (and heartburn).    Cholecalciferol (VITAMIN D3) 2000 UNITS TABS Take 1 tablet by mouth daily.    divalproex (DEPAKOTE SPRINKLE) 125 MG capsule Take 125 mg by mouth 2 (two) times daily.    Docusate Sodium (SILACE) 150 MG/15ML syrup Take 100 mg by mouth 2 (two) times daily.     guaifenesin (ROBITUSSIN) 100 MG/5ML syrup Take 100 mg by mouth every 6 (six) hours as needed for cough.     lanolin/mineral oil (KERI/THERA-DERM) LOTN Apply 1 application topically at bedtime. Apply to bilateral lower extemities    latanoprost (XALATAN) 0.005 % ophthalmic solution Place 1 drop into both eyes at bedtime.    lisinopril (PRINIVIL,ZESTRIL) 10 MG tablet Take 10 mg by mouth daily.    magnesium hydroxide (MILK OF MAGNESIA) 400 MG/5ML suspension Take 30  mLs by mouth at bedtime as needed for mild constipation.     neomycin-bacitracin-polymyxin (NEOSPORIN) ointment Apply 1 application topically as needed for wound care.     senna (SENOKOT) 8.6 MG TABS tablet Take 1 tablet by mouth at bedtime.      !! - Potential duplicate medications found. Please discuss with provider.    STOP taking these medications     aspirin 81 MG chewable tablet      loperamide (IMODIUM) 2 MG capsule        No Known Allergies Follow-up Information   Follow up with Javier Docker, MD.   Specialty:  Orthopedic Surgery   Contact information:   [address] [place] [address] [phone]        The results of significant diagnostics from this hospitalization (including imaging, microbiology, ancillary and laboratory) are listed below for reference.    Significant Diagnostic Studies: Dg Hip Complete  Left  03/14/2014   CLINICAL DATA:  80 year old female with history of trauma from a recent fall at a nursing home complaining of severe left-sided hip pain.  EXAM: LEFT HIP - COMPLETE 2+ VIEW  COMPARISON:  No priors.  FINDINGS: Four views of the bony pelvis and left hip demonstrate an intertrochanteric fracture of the left hip, with mild proximal migration of the distal fracture fragment and varus angulation (approximately 30-40 degrees) at site of fracture. Femoral head remains located in the acetabulum. Overlying soft tissues appear swollen. Bony pelvis itself appears intact, as do the visualized portions of the right proximal femur. Numerous pelvic phleboliths are noted.  IMPRESSION: 1. Mildly displaced, varus angulated and foreshortened intertrochanteric fracture of the left hip.   Electronically Signed   By: Trudie Reedaniel  Entrikin M.D.   On: 03/14/2014 10:22   Dg Hip Operative Left  03/15/2014   CLINICAL DATA:  Open reduction internal fixation for fracture  EXAM: OPERATIVE LEFT HIP  COMPARISON:  March 14, 2014  FINDINGS: Frontal and lateral  views were obtained. There is screw and rod fixation through the intertrochanteric femur fracture on the left. Overall alignment is near anatomic. The tips of the screws are in the proximal femoral head. No dislocation.  IMPRESSION: Alignment at fracture site near anatomic.  No dislocation.   Electronically Signed   By: Bretta BangWilliam  Woodruff M.D.   On: 03/15/2014 08:47   Ct Head Wo Contrast  03/11/2014   CLINICAL DATA:  Fall from bed with hematoma. No loss of consciousness. Initial encounter  EXAM: CT HEAD WITHOUT CONTRAST  CT MAXILLOFACIAL WITHOUT CONTRAST  TECHNIQUE: Multidetector CT imaging of the head and maxillofacial structures were performed using the standard protocol without intravenous contrast. Multiplanar CT image reconstructions of the maxillofacial structures were also generated.  COMPARISON:  None.  FINDINGS: CT HEAD FINDINGS  Skull and Sinuses:There is a left periorbital contusion and hematoma. No underlying calvarial fracture. At the level of the upper cervical spine there is ventral and dorsal high-density thickening. On sagittal face CT this is related to dorsal ligamentous buckling at the occiput/C1 and retrodental ligamentous thickening. No definite epidural hematoma.  Subgaleal left frontal lipoma which measures 26 mm in diameter and 5 mm in thickness.  Orbits: No acute abnormality.  Brain: No evidence of acute abnormality, such as acute infarction, hemorrhage, hydrocephalus, or mass lesion/mass effect. There is brain atrophy with prominent mesial temporal lobe volume loss correlating with history of Alzheimer's disease. Confluent periventricular cerebral white matter low density which is typical for age.  CT MAXILLOFACIAL FINDINGS  There is a large hematoma lateral to the left orbit. No evidence of globe injury or postseptal hematoma. No acute facial fracture.  Inflammatory mucosal thickening in the paranasal sinuses which appears chronic. Thickening is most extensive in the lateral recess  left sphenoid sinus and in the bilateral maxillary sinuses.  Edentulous maxilla. Cavities present within the bilateral remaining mandibular teeth.  Advanced upper and mid cervical degenerative disc disease with C3-4 anterolisthesis that is likely from associated facet osteoarthritis.  IMPRESSION: 1. No evidence of acute intracranial injury. 2. Left periorbital hematoma without fracture.   Electronically Signed   By: Tiburcio PeaJonathan  Watts M.D.   On: 03/11/2014 06:06   Dg Pelvis Portable  03/14/2014   CLINICAL DATA:  Postoperative evaluation of LEFT hip.  EXAM: PORTABLE PELVIS 1-2 VIEWS  COMPARISON:  LEFT hip radiograph March 14, 2014 at 10:08 a.m.  FINDINGS: Intra medullary rod and 2 pins through the femoral neck transfix comminuted  LEFT femur intertrochanteric fracture. Fragments appear in alignment on this single frontal radiograph. No dislocation. No destructive bony lesions  Phleboliths and moderate amount of stool project in the pelvis. LEFT hip subcutaneous gas with overlying skin staples consistent with recent surgery.  IMPRESSION: Status post interval LEFT femur ORIF for intertrochanteric fracture with improved alignment on this single frontal radiograph.   Electronically Signed   By: Awilda Metro   On: 03/14/2014 20:56   Ct Hip Left Wo Contrast  03/16/2014   CLINICAL DATA:  Left hip arthroplasty 03/14/2014 with continued left hip pain and recent drop in hemoglobin.  EXAM: CT OF THE LEFT HIP WITHOUT CONTRAST  TECHNIQUE: Multidetector CT imaging of the left hip was performed according to the standard protocol. Multiplanar CT image reconstructions were also generated.  COMPARISON:  Plain films 03/14/2014  FINDINGS: Findings demonstrate evidence of patient's recent fixation of a trochanteric fracture of the left hip with intramedullary nail intact with associated screw bridging the fracture into the femoral head also intact and in adequate position. There is minimal degenerative change over the left  sacroiliac joint and symphysis pubis joint. There is subcutaneous edema as well as air in the soft tissues over the left hip compatible with patient's recent surgery. Skin staples are present over the lateral soft tissues of the left hip. There is a small hematoma within the lateral soft tissues of the left by measuring 2.8 x 4.5 cm in AP and transverse dimension. This hematoma is located at the level of the distal aspect of the intramedullary nail.  There is moderate fecal retention over the rectum. Foley catheter is present within a decompressed bladder.  IMPRESSION: Evidence of patient's recent internal fixation of a left trochanteric hip fracture with hardware intact. Expected air and edema within the adjacent soft tissues of the left hip. There is a small hematoma over the lateral subcutaneous fat at the level of the distal aspect of the intramedullary nail measuring 2.8 x 4.5 cm.   Electronically Signed   By: Elberta Fortis M.D.   On: 03/16/2014 09:15   Dg Chest Port 1 View  03/14/2014   CLINICAL DATA:  Preoperative film for patient with a left hip fracture after a fall.  EXAM: PORTABLE CHEST - 1 VIEW  COMPARISON:  None.  FINDINGS: There is cardiomegaly without edema. The aorta is tortuous. Lungs are clear. No pneumothorax or pleural effusion. No focal bony abnormality.  IMPRESSION: Cardiomegaly without acute disease.   Electronically Signed   By: Drusilla Kanner M.D.   On: 03/14/2014 10:35   Ct Maxillofacial Wo Cm  03/11/2014   CLINICAL DATA:  Fall from bed with hematoma. No loss of consciousness. Initial encounter  EXAM: CT HEAD WITHOUT CONTRAST  CT MAXILLOFACIAL WITHOUT CONTRAST  TECHNIQUE: Multidetector CT imaging of the head and maxillofacial structures were performed using the standard protocol without intravenous contrast. Multiplanar CT image reconstructions of the maxillofacial structures were also generated.  COMPARISON:  None.  FINDINGS: CT HEAD FINDINGS  Skull and Sinuses:There is a left  periorbital contusion and hematoma. No underlying calvarial fracture. At the level of the upper cervical spine there is ventral and dorsal high-density thickening. On sagittal face CT this is related to dorsal ligamentous buckling at the occiput/C1 and retrodental ligamentous thickening. No definite epidural hematoma.  Subgaleal left frontal lipoma which measures 26 mm in diameter and 5 mm in thickness.  Orbits: No acute abnormality.  Brain: No evidence of acute abnormality, such as acute infarction, hemorrhage, hydrocephalus,  or mass lesion/mass effect. There is brain atrophy with prominent mesial temporal lobe volume loss correlating with history of Alzheimer's disease. Confluent periventricular cerebral white matter low density which is typical for age.  CT MAXILLOFACIAL FINDINGS  There is a large hematoma lateral to the left orbit. No evidence of globe injury or postseptal hematoma. No acute facial fracture.  Inflammatory mucosal thickening in the paranasal sinuses which appears chronic. Thickening is most extensive in the lateral recess left sphenoid sinus and in the bilateral maxillary sinuses.  Edentulous maxilla. Cavities present within the bilateral remaining mandibular teeth.  Advanced upper and mid cervical degenerative disc disease with C3-4 anterolisthesis that is likely from associated facet osteoarthritis.  IMPRESSION: 1. No evidence of acute intracranial injury. 2. Left periorbital hematoma without fracture.   Electronically Signed   By: Tiburcio PeaJonathan  Watts M.D.   On: 03/11/2014 06:06    Microbiology: Recent Results (from the past 240 hour(s))  URINE CULTURE     Status: None   Collection Time    03/14/14 10:40 AM      Result Value Ref Range Status   Specimen Description URINE, CATHETERIZED   Final   Special Requests NONE   Final   Culture  Setup Time     Final   Value: 03/14/2014 14:16     Performed at [facility]   Colony Count     Final   Value: NO GROWTH     Performed at  [facility]   Culture     Final   Value: NO GROWTH     Performed at [facility]   Report Status 03/15/2014 FINAL   Final  SURGICAL PCR SCREEN     Status: None   Collection Time    03/14/14  4:05 PM      Result Value Ref Range Status   MRSA, PCR NEGATIVE  NEGATIVE Final   Staphylococcus aureus NEGATIVE  NEGATIVE Final   Comment:            The Xpert SA Assay (FDA     approved for NASAL specimens     in patients over 78 years of age),     is one component of     a comprehensive surveillance     program.  Test performance has     been validated by [facility] for patients greater     than or equal to 78 year old.     It is not intended     to diagnose infection nor to     guide or monitor treatment.     Labs: Basic Metabolic Panel:  Recent Labs Lab 03/14/14 1050 03/15/14 0536 03/16/14 0435 03/17/14 0510 03/19/14 0452  NA 138 140 139 137 143  K 3.9 4.2 4.5 4.3 3.8  CL 99 100 100 100 104  CO2 27 28 29 30 30   GLUCOSE 145* 127* 119* 103* 85  BUN 30* 27* 33* 28* 32*  CREATININE 0.85 0.75 0.88 0.74 0.71  CALCIUM 9.5 8.9 8.9 8.8 8.8   Liver Function Tests: No results found for this basename: AST, ALT, ALKPHOS, BILITOT, PROT, ALBUMIN,  in the last 168 hours No results found for this basename: LIPASE, AMYLASE,  in the last 168 hours No results found for this basename: AMMONIA,  in the last 168 hours CBC:  Recent Labs Lab 03/14/14 1050 03/15/14 0536 03/16/14 0435 03/17/14 0510 03/18/14 0425 03/19/14 0452  WBC 13.3* 10.8* 10.2 11.2*  --  10.0  NEUTROABS 12.0*  --   --   --   --   --   HGB 10.1* 8.3* 7.0* 8.3* 7.4* 8.6*  HCT 31.9* 26.2* 21.5* 25.2* 22.7* 26.0*  MCV 87.2 85.9 86.7 86.6  --  87.2  PLT 205 177 155 155  --  197   Cardiac Enzymes:  Recent Labs Lab 03/14/14 1050 03/16/14 2308 03/17/14 0510 03/17/14 1002  TROPONINI <0.30 <0.30 <0.30 <0.30   BNP: BNP (last 3 results) No results found for this basename: PROBNP,  in the last  8760 hours CBG: No results found for this basename: GLUCAP,  in the last 168 hours     Signed:  Rhetta Mura  [facility] 03/19/2014, 10:08 AM

## 2014-03-19 NOTE — Progress Notes (Signed)
Physical Therapy Treatment Patient Details Name: Desiree BorgMary Myers MRN: [id number] DOB: 11-04-1920 Today's Date: 03/19/2014    History of Present Illness L IM nail 2nd Fx unwitnessed fall/injury at SNF.  Hx Alzheiemers    PT Comments    [facility].  Pt required + 2 assist and MAX VC's due to impaired cognition. Using "bear Hug" stand pivot as pt was unable to functionally use a walker and gripping to bed.  Assisted with hygiene then back to bed.  Transfer total assist + 2 pt 10%.  Follow Up Recommendations  SNF     Equipment Recommendations       Recommendations for Other Services       Precautions / Restrictions Precautions Precautions: Fall Restrictions Weight Bearing Restrictions: Yes LLE Weight Bearing: Partial weight bearing LLE Partial Weight Bearing Percentage or Pounds: 50%    Mobility  Bed Mobility Overal bed mobility: +2 for physical assistance;Needs Assistance;+ 2 for safety/equipment Bed Mobility: Supine to Sit;Sit to Supine     Supine to sit: Max assist;+2 for physical assistance Sit to supine: Max assist;+2 for physical assistance   General bed mobility comments: cues for sequence and use of R LE to self assist with limited participation by pt  Transfers Overall transfer level: Needs assistance Equipment used: None Transfers: Stand Pivot Transfers Sit to Stand: Max assist;+2 physical assistance;+2 safety/equipment         General transfer comment: using "bear Hug" squat pivot 1/4 turn [facility]taed bed [facility] with 100% VC's on direction and 2nd assist to complete pivot turn.  same tech to assist off BSC back to bed with 2nd person to perform hygiene.   Ambulation/Gait         Gait velocity: unable to attempt due to level of transfer       Stairs            Wheelchair Mobility    Modified Rankin (Stroke Patients Only)       Balance                                    Cognition                             Exercises      General Comments        Pertinent Vitals/Pain Pain Assessment: Faces Faces Pain Scale: Hurts little more Pain Location: L hip Pain Intervention(s): Limited activity within patient's tolerance;Monitored during session;Repositioned    Home Living                      Prior Function            PT Goals (current goals can now be found in the care plan section) Progress towards PT goals: Progressing toward goals    Frequency  Min 3X/week    PT Plan      Co-evaluation             End of Session Equipment Utilized During Treatment: Gait belt Activity Tolerance: Other (comment) (impaired cognition)       Time: [id number] PT Time Calculation (min): 14 min  Charges:  $Therapeutic Activity: 8-22 mins                    G Codes:  Rica Koyanagi  PTA [facility]  Acute  Rehab Pager      [phone]

## 2015-01-20 IMAGING — CR DG HIP (WITH OR WITHOUT PELVIS) 2-3V*L*
4 series · 4 of 4 positions shown · non-contrast
Comparison: No priors.

CLINICAL DATA: [AGE] female with history of trauma from a
recent fall at a [HOSPITAL] complaining of severe left-sided hip
pain.

EXAM:
LEFT HIP - COMPLETE 2+ VIEW

[x pelvis]
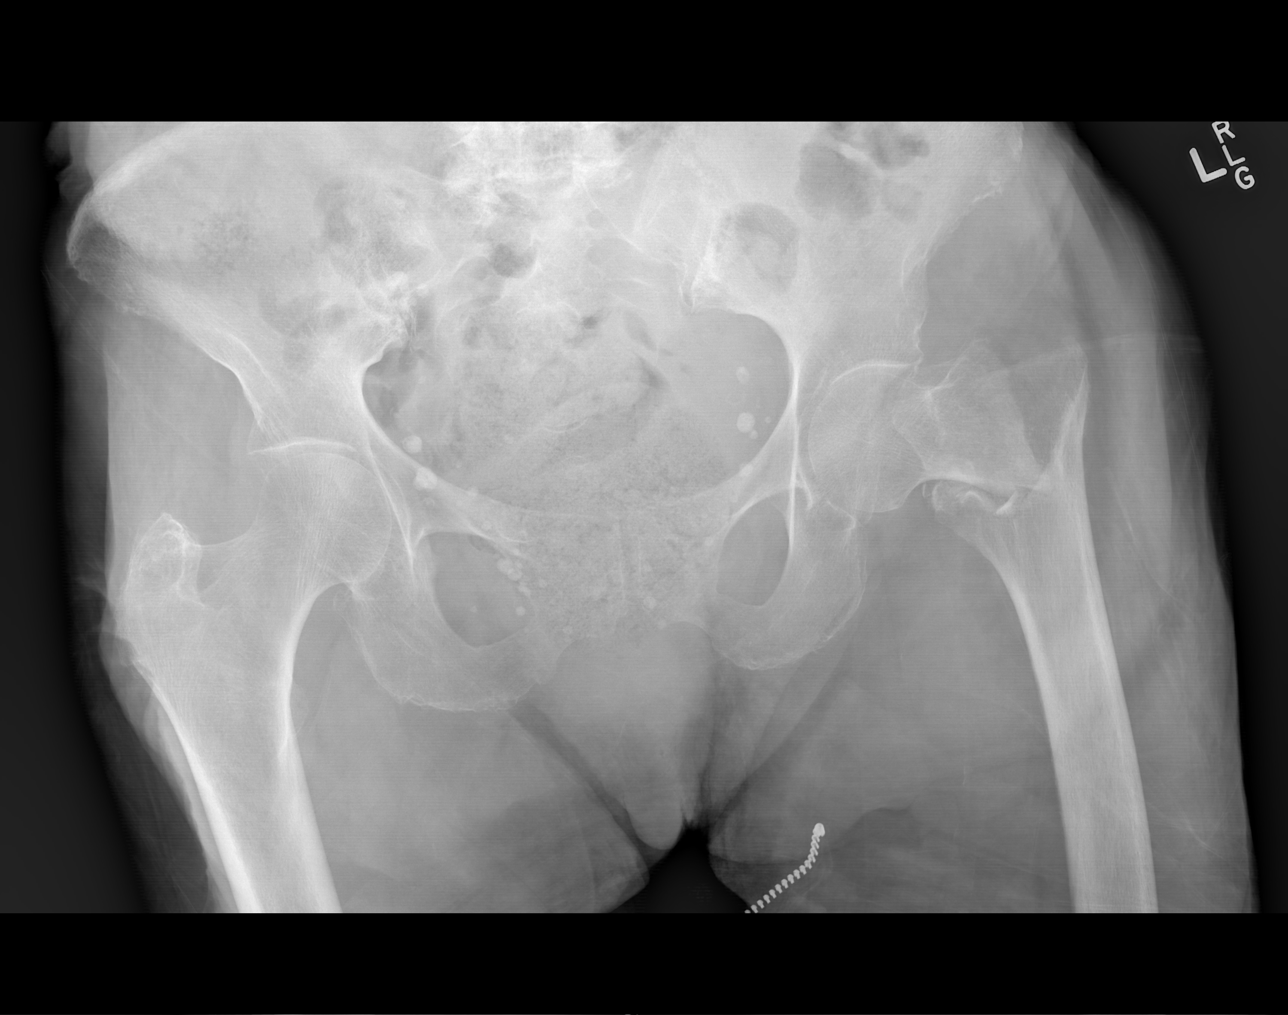

[x hip ap left]
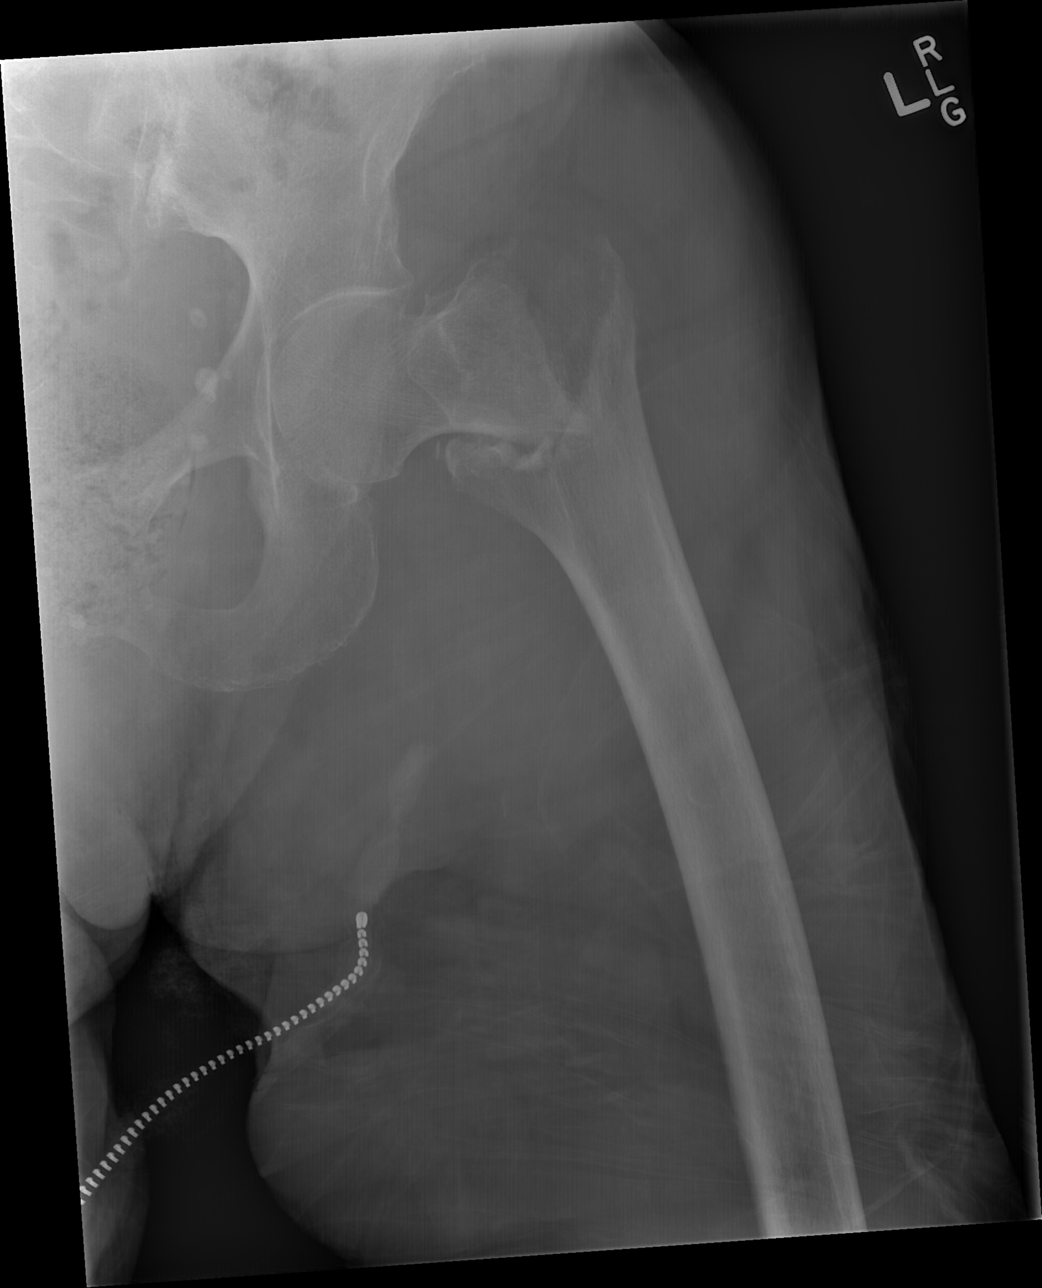

[w hip lat left (1 of 2)]
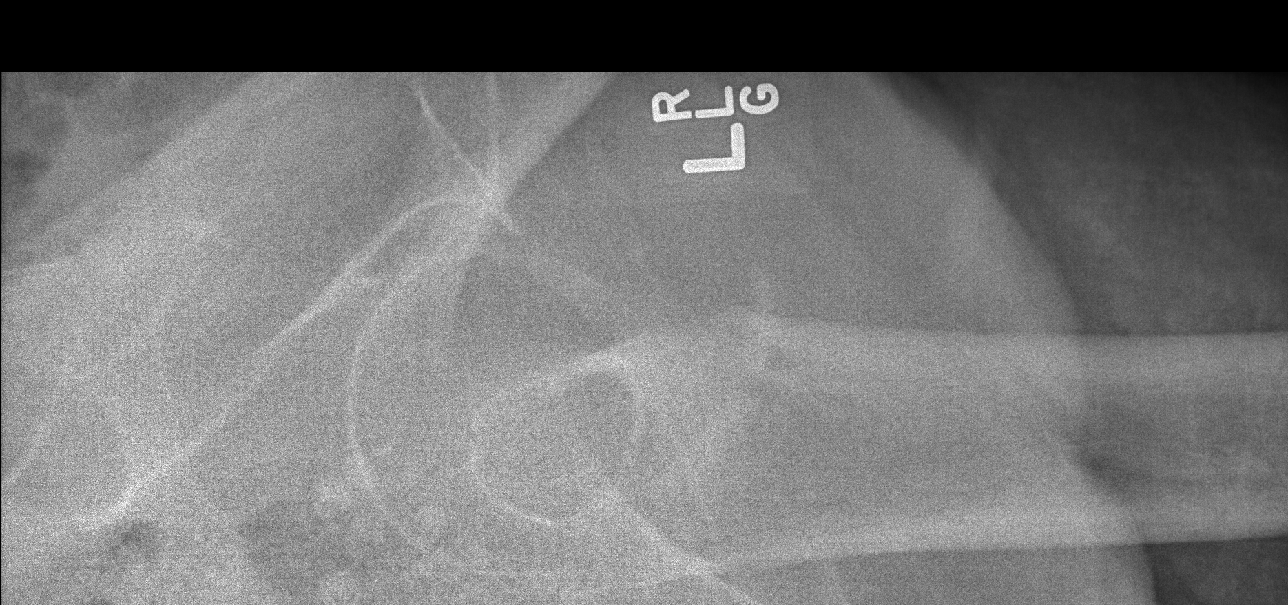

[w hip lat left (2 of 2)]
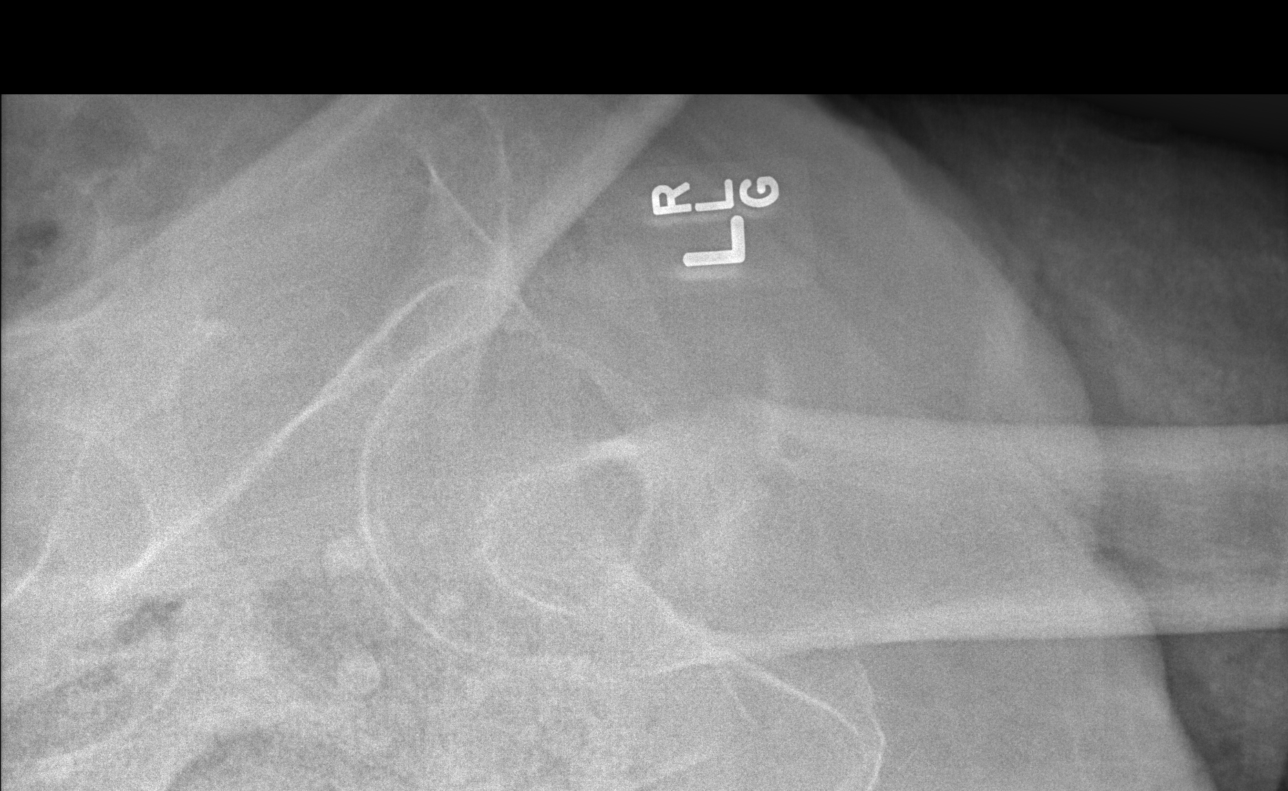

[4 of 4 positions shown; findings below may reference images not displayed]

FINDINGS: Four views of the bony pelvis and left hip demonstrate an
intertrochanteric fracture of the left hip, with mild proximal
migration of the distal fracture fragment and varus angulation
(approximately 30-40 degrees) at site of fracture. Femoral head
remains located in the acetabulum. Overlying soft tissues appear
swollen. Bony pelvis itself appears intact, as do the visualized
portions of the right proximal femur. Numerous pelvic phleboliths
are noted.
IMPRESSION: 1. Mildly displaced, varus angulated and foreshortened
intertrochanteric fracture of the left hip.

## 2015-01-20 IMAGING — CR DG PORTABLE PELVIS
1 series · 1 of 1 positions shown · non-contrast
Comparison: LEFT hip radiograph March 14, 2014 at [DATE] a.m.

CLINICAL DATA: Postoperative evaluation of LEFT hip.

EXAM:
PORTABLE PELVIS 1-2 VIEWS

[AP]
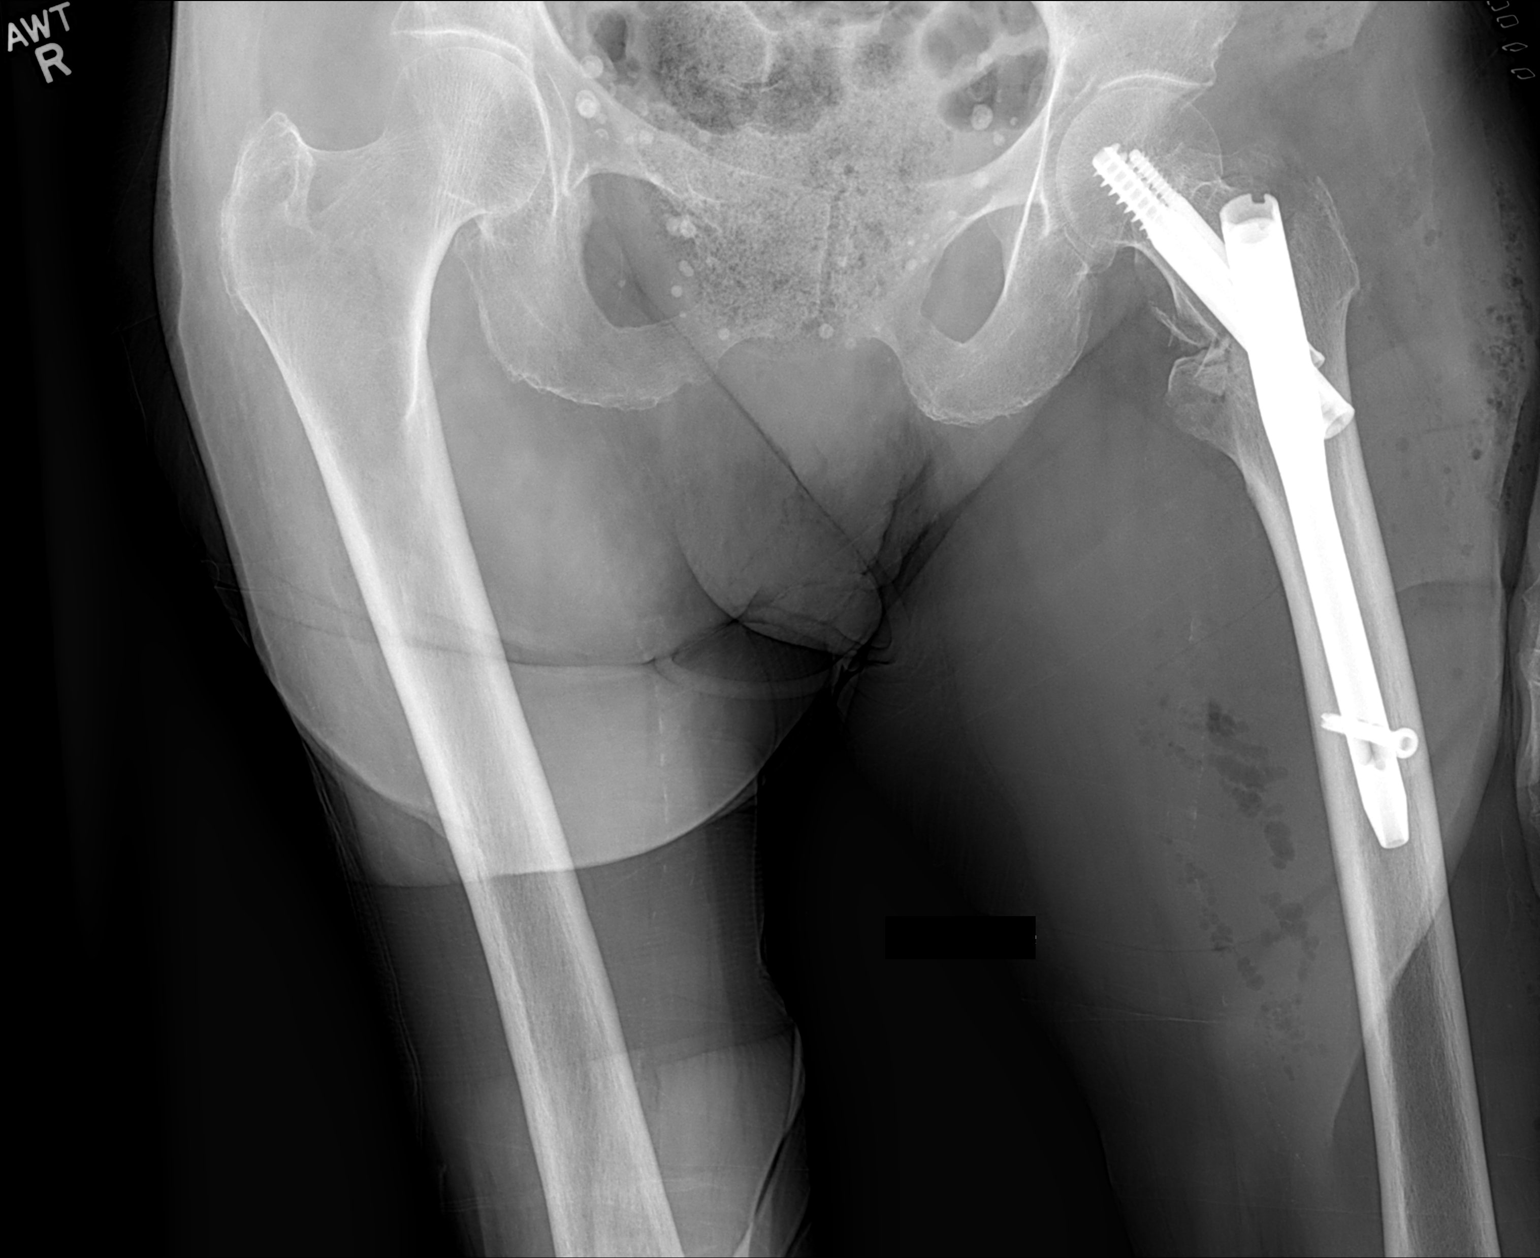

[1 of 1 positions shown; findings below may reference images not displayed]

FINDINGS: Intra medullary rod and 2 pins through the femoral neck transfix
comminuted LEFT femur intertrochanteric fracture. Fragments appear
in alignment on this single frontal radiograph. No dislocation. No
destructive bony lesions

Phleboliths and moderate amount of stool project in the pelvis. LEFT
hip subcutaneous gas with overlying skin staples consistent with
recent surgery.
IMPRESSION: Status post interval LEFT femur ORIF for intertrochanteric fracture
with improved alignment on this single frontal radiograph.

  By: Sione Secundino

## 2022-08-30 DEATH — deceased
